# Patient Record
Sex: Female | Born: 1975 | Race: White | Hispanic: No | Marital: Married | Smoking: Former smoker
Health system: Southern US, Community
[De-identification: ages and names within clinical notes are randomized; demographics above are authoritative.]

## PROBLEM LIST (undated history)

## (undated) DIAGNOSIS — I10 Essential (primary) hypertension: Secondary | ICD-10-CM

## (undated) HISTORY — PX: OTHER SURGICAL HISTORY: SHX169

---

## 2012-02-14 ENCOUNTER — Emergency Department (INDEPENDENT_AMBULATORY_CARE_PROVIDER_SITE_OTHER): Payer: Managed Care, Other (non HMO)

## 2012-02-14 ENCOUNTER — Emergency Department (HOSPITAL_BASED_OUTPATIENT_CLINIC_OR_DEPARTMENT_OTHER)
Admission: EM | Admit: 2012-02-14 | Discharge: 2012-02-14 | Disposition: A | Discharge status: Home / Self Care | Payer: Managed Care, Other (non HMO) | Attending: Emergency Medicine | Admitting: Emergency Medicine

## 2012-02-14 ENCOUNTER — Encounter (HOSPITAL_BASED_OUTPATIENT_CLINIC_OR_DEPARTMENT_OTHER): Payer: Self-pay | Admitting: *Deleted

## 2012-02-14 DIAGNOSIS — Z331 Pregnant state, incidental: Secondary | ICD-10-CM

## 2012-02-14 DIAGNOSIS — D259 Leiomyoma of uterus, unspecified: Secondary | ICD-10-CM

## 2012-02-14 DIAGNOSIS — N898 Other specified noninflammatory disorders of vagina: Secondary | ICD-10-CM

## 2012-02-14 DIAGNOSIS — O200 Threatened abortion: Secondary | ICD-10-CM | POA: Insufficient documentation

## 2012-02-14 DIAGNOSIS — I10 Essential (primary) hypertension: Secondary | ICD-10-CM | POA: Insufficient documentation

## 2012-02-14 HISTORY — DX: Essential (primary) hypertension: I10

## 2012-02-14 LAB — URINE MICROSCOPIC-ADD ON

## 2012-02-14 LAB — BASIC METABOLIC PANEL
BUN: 8 mg/dL (ref 6–23)
CO2: 25 mEq/L (ref 19–32)
Calcium: 9.7 mg/dL (ref 8.4–10.5)
Creatinine, Ser: 0.6 mg/dL (ref 0.50–1.10)
GFR calc non Af Amer: >90 mL/min (ref >90)
Glucose, Bld: 85 mg/dL (ref 70–99)
Sodium: 139 mEq/L (ref 135–145)

## 2012-02-14 LAB — CBC
Hemoglobin: 13.7 g/dL (ref 12.0–15.0)
MCH: 32.2 pg (ref 26.0–34.0)
MCHC: 36.1 g/dL — ABNORMAL HIGH (ref 30.0–36.0)
MCV: 89.2 fL (ref 78.0–100.0)
RBC: 4.26 MIL/uL (ref 3.87–5.11)

## 2012-02-14 LAB — URINALYSIS, ROUTINE W REFLEX MICROSCOPIC
Bilirubin Urine: NEGATIVE
Glucose, UA: NEGATIVE mg/dL
Ketones, ur: NEGATIVE mg/dL
Leukocytes, UA: NEGATIVE
Protein, ur: 30 mg/dL — AB
pH: 7 (ref 5.0–8.0)

## 2012-02-14 LAB — WET PREP, GENITAL
Trich, Wet Prep: NONE SEEN
Yeast Wet Prep HPF POC: NONE SEEN

## 2012-02-14 MED ORDER — RHO D IMMUNE GLOBULIN 1500 UNIT/2ML IJ SOLN
INTRAMUSCULAR | Status: AC
  Filled 2012-02-14: qty 2

## 2012-02-14 MED ORDER — RHO D IMMUNE GLOBULIN 1500 UNIT/2ML IJ SOLN
300.0000 ug | Freq: Once | INTRAMUSCULAR | Status: AC
  Administered 2012-02-14: 300 ug via INTRAMUSCULAR

## 2012-02-14 NOTE — ED Provider Notes (Signed)
History     CSN: [id number]  Arrival date & time 02/14/12  1026   First MD Initiated Contact with Patient 02/14/12 1120      Chief Complaint  Patient presents with  . Vaginal Bleeding    (Consider location/radiation/quality/duration/timing/severity/associated sxs/prior treatment) HPI Comments: Patient presents with a positive pregnancy test just a few weeks ago.  She had noted some spotting last week but over the last day has noted some heavier bleeding at work this morning passed a large clot.  She has some mild lower abdominal cramping.  No nausea or vomiting.  This is her first pregnancy.  No other vaginal discharge.  No other abdominal surgeries.  No anticoagulation or antiplatelet agents.  Her primary care physician on finding out about her positive pregnancy test did stop her metoprolol and lisinopril as well.  Last menstrual period February 9 and normal for her.  Patient is a 36 y.o. female presenting with vaginal bleeding. The history is provided by the patient. No language interpreter was used.  Vaginal Bleeding This is a new problem. The current episode started 2 days ago. Pertinent negatives include no chest pain, no abdominal pain, no headaches and no shortness of breath.    Past Medical History  Diagnosis Date  . Hypertension     History reviewed. No pertinent past surgical history.  History reviewed. No pertinent family history.  History  Substance Use Topics  . Smoking status: Former Games developer  . Smokeless tobacco: Not on file  . Alcohol Use: Yes     stopped when found out pregnant prior to that glass or 2 of wine per day    OB History    Grav Para Term Preterm Abortions TAB SAB Ect Mult Living   1               Review of Systems  Constitutional: Negative.  Negative for fever and chills.  HENT: Negative.   Eyes: Negative.  Negative for discharge and redness.  Respiratory: Negative.  Negative for cough and shortness of breath.   Cardiovascular: Negative.   Negative for chest pain.  Gastrointestinal: Negative.  Negative for nausea, vomiting, abdominal pain and diarrhea.  Genitourinary: Positive for vaginal bleeding and pelvic pain. Negative for dysuria and vaginal discharge.  Musculoskeletal: Negative.  Negative for back pain.  Skin: Negative.  Negative for color change and rash.  Neurological: Negative.  Negative for syncope and headaches.  Hematological: Negative.  Negative for adenopathy.  Psychiatric/Behavioral: Negative.  Negative for confusion.  All other systems reviewed and are negative.    Allergies  Sulfa antibiotics  Home Medications   Current Outpatient Rx  Name Route Sig Dispense Refill  . METHYLDOPA 250 MG PO TABS Oral Take 250 mg by mouth 3 (three) times daily.      BP 141/93  Pulse 90  Resp 20  Ht 5' 4.5" (1.638 m)  Wt 155 lb (70.308 kg)  BMI 26.19 kg/m2  SpO2 100%  LMP 01/01/2012  Physical Exam  Nursing note and vitals reviewed. Constitutional: She is oriented to person, place, and time. She appears well-developed and well-nourished.  Non-toxic appearance. She does not have a sickly appearance.  HENT:  Head: Normocephalic and atraumatic.  Eyes: Conjunctivae, EOM and lids are normal. Pupils are equal, round, and reactive to light. No scleral icterus.  Neck: Trachea normal and normal range of motion. Neck supple.  Cardiovascular: Normal rate, regular rhythm and normal heart sounds.   Pulmonary/Chest: Effort normal and breath sounds normal. No  respiratory distress. She has no wheezes. She has no rales.  Abdominal: Soft. Normal appearance. She exhibits no distension. There is no tenderness. There is no rebound, no guarding and no CVA tenderness.  Genitourinary: There is no rash on the right labia. There is no rash on the left labia. There is bleeding around the vagina.       Closed cervical os.  Blood present in the vaginal vault.  No clots.  Musculoskeletal: Normal range of motion.  Neurological: She is alert  and oriented to person, place, and time. She has normal strength.  Skin: Skin is warm, dry and intact. No rash noted.  Psychiatric: She has a normal mood and affect. Her behavior is normal. Judgment and thought content normal.    ED Course  Procedures (including critical care time)  Labs Reviewed  URINALYSIS, ROUTINE W REFLEX MICROSCOPIC - Abnormal; Notable for the following:    Hgb urine dipstick LARGE (*)    Protein, ur 30 (*)    All other components within normal limits  PREGNANCY, URINE - Abnormal; Notable for the following:    Preg Test, Ur POSITIVE (*)    All other components within normal limits  CBC - Abnormal; Notable for the following:    MCHC 36.1 (*)    All other components within normal limits  BASIC METABOLIC PANEL - Abnormal; Notable for the following:    Potassium 3.3 (*)    All other components within normal limits  HCG, QUANTITATIVE, PREGNANCY - Abnormal; Notable for the following:    hCG, Beta Chain, Quant, S 8828 (*)    All other components within normal limits  WET PREP, GENITAL - Abnormal; Notable for the following:    Clue Cells Wet Prep HPF POC FEW (*)    WBC, Wet Prep HPF POC FEW (*)    All other components within normal limits  ABO/RH  URINE MICROSCOPIC-ADD ON  GC/CHLAMYDIA PROBE AMP, URINE  GC/CHLAMYDIA PROBE AMP, GENITAL   US Ob Comp Less 14 Wks  02/14/2012  *RADIOLOGY REPORT*  Clinical Data: Pregnant with vaginal bleeding.  OBSTETRIC <14 WK [place] AND TRANSVAGINAL OB US  Technique:  Both transabdominal and transvaginal ultrasound examinations were performed for complete evaluation of the gestation as well as the maternal uterus, adnexal regions, and pelvic cul-de-sac.  Transvaginal technique was performed to assess early pregnancy.  Comparison:  None.  Intrauterine gestational sac:  Visualized/normal in shape. Yolk sac: Present Embryo: Present Cardiac Activity: Present Heart Rate: 117 bpm  MSD:   mm      w     d CRL: 4.8   mm  six   w  one   d           [place]  EDC: 10/08/2012.  Maternal uterus/adnexae: No subchorionic hemorrhage. Right ovary not visualized. Corpus luteum cyst left ovary. Small amount of fluid noted in the endometrial canal. Small uterine fibroids are noted.  IMPRESSION:  1.  Single living intrauterine embryo estimated at 6 weeks and 1 day is gestation. 2.  No subchorionic hemorrhage. 3.  Normal left ovary.  Right ovary not visualized. 4.  Small uterine fibroids.  Original Report Authenticated By: P. Loralie Champagne, M.D.   US Ob Transvaginal  02/14/2012  *RADIOLOGY REPORT*  Clinical Data: Pregnant with vaginal bleeding.  OBSTETRIC <14 WK [place] AND TRANSVAGINAL OB US  Technique:  Both transabdominal and transvaginal ultrasound examinations were performed for complete evaluation of the gestation as well as the maternal uterus, adnexal regions, and  pelvic cul-de-sac.  Transvaginal technique was performed to assess early pregnancy.  Comparison:  None.  Intrauterine gestational sac:  Visualized/normal in shape. Yolk sac: Present Embryo: Present Cardiac Activity: Present Heart Rate: 117 bpm  MSD:   mm      w     d CRL: 4.8   mm  six   w  one   d           [place] EDC: 10/08/2012.  Maternal uterus/adnexae: No subchorionic hemorrhage. Right ovary not visualized. Corpus luteum cyst left ovary. Small amount of fluid noted in the endometrial canal. Small uterine fibroids are noted.  IMPRESSION:  1.  Single living intrauterine embryo estimated at 6 weeks and 1 day is gestation. 2.  No subchorionic hemorrhage. 3.  Normal left ovary.  Right ovary not visualized. 4.  Small uterine fibroids.  Original Report Authenticated By: P. Loralie Champagne, M.D.     No diagnosis found.    MDM  Patient with a threatened abortion at this point in time.  She shows signs of an IUP on her ultrasound with quantitative hCG levels that correlate.  Patient also has some mild bleeding.  She was given RhoGAM 2 to her Rh status being negative.  She already had some followup scheduled with her  OB on April 9 and have encouraged her to call and see about a week check sooner given the bleeding and cramping she's been experiencing.  Patient understands that she may still going to have a miscarriage but it is unclear at this time.  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        Nat Christen, MD 02/14/12 1325

## 2012-02-14 NOTE — ED Notes (Signed)
Last  Period feb 9 had positive pregnancy test had mild spotting brownish when wiping last week. 20 min pta started having bright red blood no cramping hasnt seen OB yet

## 2012-02-14 NOTE — ED Notes (Signed)
Blood Bank from lab called and reported pt is AB negative.  Dr. Golda Acre informed.

## 2012-02-14 NOTE — Discharge Instructions (Signed)
Threatened Miscarriage  Bleeding during the first 20 weeks of pregnancy is common. This is sometimes called a threatened miscarriage. This is a pregnancy that is threatening to end before the twentieth week of pregnancy. Often this bleeding stops with bed rest or decreased activities as suggested by your caregiver and the pregnancy continues without any more problems. You may be asked to not have sexual intercourse, have orgasms or use tampons until further notice. Sometimes a threatened miscarriage can progress to a complete or incomplete miscarriage. This may or may not require further treatment. Some miscarriages occur before a woman misses a menstrual period and knows she is pregnant.  Miscarriages occur in 15 to 20% of all pregnancies and usually occur during the first 13 weeks of the pregnancy. The exact cause of a miscarriage is usually never known. A miscarriage is natures way of ending a pregnancy that is abnormal or would not make it to term. There are some things that may put you at risk to have a miscarriage, such as:   Hormone problems.   Infection of the uterus or cervix.   Chronic illness, diabetes for example, especially if it is not controlled.   Abnormal shaped uterus.   Fibroids in the uterus.   Incompetent cervix (the cervix is too weak to hold the baby).   Smoking.   Drinking too much alcohol. It's best not to drink any alcohol when you are pregnant.   Taking illegal drugs.  TREATMENT   When a miscarriage becomes complete and all products of conception (all the tissue in the uterus) have been passed, often no treatment is needed. If you think you passed tissue, save it in a container and take it to your doctor for evaluation. If the miscarriage is incomplete (parts of the fetus or placenta remain in the uterus), further treatment may be needed. The most common reason for further treatment is continued bleeding (hemorrhage) because pregnancy tissue did not pass out of the uterus. This  often occurs if a miscarriage is incomplete. Tissue left behind may also become infected. Treatment usually is dilatation and curettage (the removal of the remaining products of pregnancy. This can be done by a simple sucking procedure (suction curettage) or a simple scraping of the inside of the uterus. This may be done in the hospital or in the caregiver's office. This is only done when your caregiver knows that there is no chance for the pregnancy to proceed to term. This is determined by physical examination, negative pregnancy test, falling pregnancy hormone count and/or, an ultrasound revealing a dead fetus.  Miscarriages are often a very emotional time for prospective mothers and fathers. This is not you or your partners fault. It did not occur because of an inadequacy in you or your partner. Nearly all miscarriages occur because the pregnancy has started off wrongly. At least half of these pregnancies have a chromosomal abnormality. It is almost always not inherited. Others may have developmental problems with the fetus or placenta. This does not always show up even when the products miscarried are studied under the microscope. The miscarriage is nearly always not your fault and it is not likely that you could have prevented it from happening. If you are having emotional and grieving problems, talk to your health care provider and even seek counseling, if necessary, before getting pregnant again. You can begin trying for another pregnancy as soon as your caregiver says it is OK.  HOME CARE INSTRUCTIONS    Your caregiver may order   and cramping you are having. You may be limited to only getting up to go to the bathroom. You may be allowed to continue light activity. You may need to make arrangements for the care of your other children and for any other responsibilities.   Keep track of the number of pads you use each day, how often you have to change pads  and how saturated (soaked) they are. Record this information.   DO NOT USE TAMPONS. Do not douche, have sexual intercourse or orgasms until approved by your caregiver.   You may receive a follow up appointment for re-evaluation of your pregnancy and a repeat blood test. Re-evaluation often occurs after 2 days and again in 4 to 6 weeks. It is very important that you follow-up in the recommended time period.   If you are Rh negative and the father is Rh positive or you do not know the fathers' blood type, you may receive a shot (Rh immune globulin) to help prevent abnormal antibodies that can develop and affect the baby in any future pregnancies.  SEEK IMMEDIATE MEDICAL CARE IF:  You have severe cramps in your stomach, back, or abdomen.   You have a sudden onset of severe pain in the lower part of your abdomen.   You develop chills.   You run an unexplained temperature of 101 F (38.3 C) or higher.   You pass large clots or tissue. Save any tissue for your caregiver to inspect.   Your bleeding increases or you become light-headed, weak, or have fainting episodes.   You have a gush of fluid from your vagina.   You pass out. This could mean you have a tubal (ectopic) pregnancy.  Document Released: 11/08/2005 Document Revised: 10/28/2011 Document Reviewed: 06/24/2008 [facility] Patient Information 2012 [place], [place].Rh0 [D] Immune Globulin injection What is this medicine? RhO [D] IMMUNE GLOBULIN (i MYOON GLOB yoo lin) is used to treat idiopathic thrombocytopenic purpura (ITP). This medicine is used in RhO negative mothers who are pregnant with a RhO positive child. It is also used after a transfusion of RhO positive blood into a RhO negative person. This medicine may be used for other purposes; ask your health care provider or pharmacist if you have questions. What should I tell my health care provider before I take this medicine? They need to know if you have any of these  conditions: -bleeding disorders -low levels of immunoglobulin A in the body -no spleen -an unusual or allergic reaction to human immune globulin, other medicines, foods, dyes, or preservatives -pregnant or trying to get pregnant -breast-feeding How should I use this medicine? This medicine is for injection into a muscle or into a vein. It is given by a health care professional in a hospital or clinic setting. Talk to your pediatrician regarding the use of this medicine in children. This medicine is not approved for use in children. Overdosage: If you think you have taken too much of this medicine contact a poison control center or emergency room at once. NOTE: This medicine is only for you. Do not share this medicine with others. What if I miss a dose? It is important not to miss your dose. Call your doctor or health care professional if you are unable to keep an appointment. What may interact with this medicine? -live virus vaccines, like measles, mumps, or rubella This list may not describe all possible interactions. Give your health care provider a list of all the medicines, herbs, non-prescription drugs, or dietary supplements you  use. Also tell them if you smoke, drink alcohol, or use illegal drugs. Some items may interact with your medicine. What should I watch for while using this medicine? This medicine is made from human blood. It may be possible to pass an infection in this medicine. Talk to your doctor about the risks and benefits of this medicine. This medicine may interfere with live virus vaccines. Before you get live virus vaccines tell your health care professional if you have received this medicine within the past 3 months. What side effects may I notice from receiving this medicine? Side effects that you should report to your doctor or health care professional as soon as possible: -allergic reactions like skin rash, itching or hives, swelling of the face, lips, or  tongue -breathing problems -chest pain or tightness -yellowing of the eyes or skin Side effects that usually do not require medical attention (report to your doctor or health care professional if they continue or are bothersome): -fever -pain and tenderness at site where injected This list may not describe all possible side effects. Call your doctor for medical advice about side effects. You may report side effects to FDA at 1-800-FDA-1088. Where should I keep my medicine? This drug is given in a hospital or clinic and will not be stored at home. NOTE: This sheet is a summary. It may not cover all possible information. If you have questions about this medicine, talk to your doctor, pharmacist, or health care provider.  2012, [employer]. (07/08/2008 2:06:10 PM)

## 2012-02-14 NOTE — ED Notes (Signed)
Pt returned from ultrasound.  No change in pt condition. 

## 2012-02-14 NOTE — ED Notes (Signed)
Patient transported to Ultrasound 

## 2012-02-15 LAB — GC/CHLAMYDIA PROBE AMP, GENITAL: GC Probe Amp, Genital: NEGATIVE

## 2012-03-01 ENCOUNTER — Other Ambulatory Visit: Payer: Self-pay | Admitting: Obstetrics and Gynecology

## 2012-04-14 ENCOUNTER — Inpatient Hospital Stay (HOSPITAL_COMMUNITY)
Admission: AD | Admit: 2012-04-14 | Discharge: 2012-04-14 | Disposition: A | Discharge status: Home / Self Care | Payer: Managed Care, Other (non HMO) | Source: Ambulatory Visit | Attending: Emergency Medicine | Admitting: Emergency Medicine

## 2012-04-14 ENCOUNTER — Encounter (HOSPITAL_COMMUNITY): Payer: Self-pay

## 2012-04-14 ENCOUNTER — Inpatient Hospital Stay (HOSPITAL_COMMUNITY): Payer: Managed Care, Other (non HMO)

## 2012-04-14 DIAGNOSIS — I10 Essential (primary) hypertension: Secondary | ICD-10-CM | POA: Insufficient documentation

## 2012-04-14 DIAGNOSIS — R0789 Other chest pain: Secondary | ICD-10-CM

## 2012-04-14 DIAGNOSIS — R079 Chest pain, unspecified: Secondary | ICD-10-CM | POA: Insufficient documentation

## 2012-04-14 LAB — D-DIMER, QUANTITATIVE: D-Dimer, Quant: <0.22 ug/mL-FEU (ref 0.00–0.48)

## 2012-04-14 MED ORDER — ASPIRIN 81 MG PO CHEW
CHEWABLE_TABLET | ORAL | Status: AC
  Administered 2012-04-14: 324 mg
  Filled 2012-04-14: qty 4

## 2012-04-14 MED ORDER — NITROGLYCERIN 0.4 MG SL SUBL
SUBLINGUAL_TABLET | SUBLINGUAL | Status: AC
  Administered 2012-04-14: 17:00:00
  Filled 2012-04-14: qty 25

## 2012-04-14 MED ORDER — ASPIRIN 325 MG PO TABS
325.0000 mg | ORAL_TABLET | Freq: Once | ORAL | Status: DC
  Filled 2012-04-14: qty 1

## 2012-04-14 NOTE — MAU Note (Signed)
O2 at 4\liters per min per chest pain protocol.

## 2012-04-14 NOTE — MAU Note (Signed)
Lauren Blackburn CNM into see patient.

## 2012-04-14 NOTE — MAU Note (Signed)
Pt did complain of chest discomfort and NTG SL administrated.

## 2012-04-14 NOTE — ED Notes (Signed)
Pt. States "I took my blood pressure this morning and it was higher than normal and I also had sharp chest pain radiating down left arm and I got scared and decided to come in".  Pt. Reports history of HTN, last dose last night.

## 2012-04-14 NOTE — MAU Note (Signed)
BP 184/115 with some chest pains patient does not have a PCP was told to come in by Dr. Vance Gather nurse, having some child discomfort, patient is not pregnant, hurts through to her back.

## 2012-04-14 NOTE — ED Provider Notes (Signed)
History     CSN: [id number]  Arrival date & time 04/14/12  1555   First MD Initiated Contact with Patient 04/14/12 1730      Chief Complaint  Patient presents with  . Hypertension    (Consider location/radiation/quality/duration/timing/severity/associated sxs/prior treatment) HPI Comments: Pt with hx of htn.  6 weeks ago had D&C after [redacted] weeks pregnant.  Today has had intermittent sharp chest pain in 2-3 second episodes.  No nausea or dyspnea.  No recent leg swelling or immobility.  Otherwise doing well.  Checked BP today and it was elevated so came to ED.  Patient is a 36 y.o. female presenting with hypertension. The history is provided by the patient.  Hypertension This is a recurrent problem. The current episode started today. The problem occurs constantly. The problem has been unchanged. Pertinent negatives include no abdominal pain, chest pain, congestion, fever, nausea, numbness, rash or vomiting. The symptoms are aggravated by nothing. She has tried nothing for the symptoms.    Past Medical History  Diagnosis Date  . Hypertension     No past surgical history on file.  No family history on file.  History  Substance Use Topics  . Smoking status: Former Games developer  . Smokeless tobacco: Not on file  . Alcohol Use: Yes     stopped when found out pregnant prior to that glass or 2 of wine per day    OB History    Grav Para Term Preterm Abortions TAB SAB Ect Mult Living   1               Review of Systems  Constitutional: Negative for fever and activity change.  HENT: Negative for congestion.   Eyes: Negative for visual disturbance.  Respiratory: Negative for chest tightness and shortness of breath.   Cardiovascular: Negative for chest pain and leg swelling.  Gastrointestinal: Negative for nausea, vomiting and abdominal pain.  Genitourinary: Negative for dysuria.  Skin: Negative for rash.  Neurological: Negative for syncope and numbness.  Psychiatric/Behavioral:  Negative for behavioral problems.    Allergies  Sulfa antibiotics  Home Medications   Current Outpatient Rx  Name Route Sig Dispense Refill  . ATENOLOL 50 MG PO TABS Oral Take 50 mg by mouth every evening.    Marland Kitchen OMEGA-3 FATTY ACIDS 1000 MG PO CAPS Oral Take 1 g by mouth 2 (two) times daily.     . IBUPROFEN 200 MG PO TABS Oral Take 200 mg by mouth every 6 (six) hours as needed. For pain    . LORATADINE 10 MG PO TABS Oral Take 10 mg by mouth daily as needed. For allergies    . PRENATAL MULTIVITAMIN CH Oral Take 1 tablet by mouth daily.      BP 163/100  Pulse 86  Temp(Src) 98.8 F (37.1 C) (Oral)  Resp 16  Ht 5' 4.5" (1.638 m)  Wt 156 lb 9.6 oz (71.033 kg)  BMI 26.47 kg/m2  SpO2 99%  LMP 04/07/2012  Breastfeeding? No  Physical Exam  Constitutional: She is oriented to person, place, and time. She appears well-developed and well-nourished.  HENT:  Head: Normocephalic and atraumatic.  Eyes: Conjunctivae and EOM are normal. Pupils are equal, round, and reactive to light. No scleral icterus.  Neck: Normal range of motion. Neck supple.  Cardiovascular: Normal rate and regular rhythm.  Exam reveals no gallop and no friction rub.   No murmur heard. Pulmonary/Chest: Effort normal and breath sounds normal. No respiratory distress. She has no wheezes. She  has no rales. She exhibits tenderness (mild, central).  Abdominal: Soft. She exhibits no distension and no mass. There is no tenderness. There is no rebound and no guarding.  Musculoskeletal: Normal range of motion.  Neurological: She is alert and oriented to person, place, and time. She has normal reflexes. No cranial nerve deficit.  Skin: Skin is warm and dry. No rash noted.  Psychiatric: She has a normal mood and affect. Her behavior is normal. Judgment and thought content normal.    ED Course  Procedures (including critical care time)   Date: 04/14/2012  Rate: 70  Rhythm: normal sinus rhythm  QRS Axis: normal  Intervals:  normal  ST/T Wave abnormalities: normal  Conduction Disutrbances:none  Narrative Interpretation:   Old EKG Reviewed: unchanged from this am     Labs Reviewed  D-DIMER, QUANTITATIVE   Dg Chest 2 View  04/14/2012  *RADIOLOGY REPORT*  Clinical Data: Hypertension.  CHEST - 2 VIEW  Comparison: None.  Findings: Mildly prominent cardiopericardial silhouette is thought to be due to cardiac accentuation related to pectus excavatum.  The lungs appear clear.  No pleural effusion noted.  IMPRESSION:  1.  Pectus excavatum, causing accentuation of the cardiac shadow. Otherwise, no significant abnormality identified.  Original Report Authenticated By: Dellia Cloud, M.D.     1. Hypertension   2. Atypical chest pain       MDM  Pt with hx of htn.  6 weeks ago had D&C after [redacted] weeks pregnant.  Today has had intermittent sharp chest pain in 2-3 second episodes.  No nausea or dyspnea.  No recent leg swelling or immobility.  Otherwise doing well.  Checked BP today and it was elevated so came to ED.  VSS and well appearing.  CXR and EKG uncocnerning.  Very atypical hx for ACS or hypertensive urgency type chest pain.  Very lwo suspicion for PE.  Ddimer negative.  Pt feeling well in ED.  No evidence of end organ damage from htn.  Has PCP and will discuss restarting BP meds that pt was on before pregnancy.        Army Chaco, MD 04/14/12 2232

## 2012-04-14 NOTE — MAU Provider Note (Signed)
Chief Complaint:  Hypertension    First Provider Initiated Contact with Patient 04/14/12 1614      Lauren Blackburn is  36 y.o. G1P0.  Patient's last menstrual period was 04/07/2012..   She presents complaining of HTN and chest pain radiating into left arm.  Obstetrical/Gynecological History: OB History    Grav Para Term Preterm Abortions TAB SAB Ect Mult Living   1               Past Medical History: Past Medical History  Diagnosis Date  . Hypertension     Past Surgical History: No past surgical history on file.  Family History: No family history on file.  Social History: History  Substance Use Topics  . Smoking status: Former Games developer  . Smokeless tobacco: Not on file  . Alcohol Use: Yes     stopped when found out pregnant prior to that glass or 2 of wine per day    Allergies:  Allergies  Allergen Reactions  . Sulfa Antibiotics     Prescriptions prior to admission  Medication Sig Dispense Refill  . methyldopa (ALDOMET) 250 MG tablet Take 250 mg by mouth 3 (three) times daily.        Review of Systems - History obtained from the patient General ROS: negative for - chills or fever Hematological and Lymphatic ROS: negative Respiratory ROS: no cough, shortness of breath, or wheezing Cardiovascular ROS: positive for - chest pain, negative for - dyspnea on exertion, edema or palpitations Gastrointestinal ROS: no abdominal pain, change in bowel habits, or black or bloody stools negative for - diarrhea or nausea/vomiting Genito-Urinary ROS: no dysuria, trouble voiding, or hematuria Neurological ROS: no TIA or stroke symptoms  Physical Exam   Blood pressure 189/120, pulse 80, temperature 98.3 F (36.8 C), temperature source Oral, resp. rate 18, height 5' 4.5" (1.638 m), weight 156 lb 9.6 oz (71.033 kg), last menstrual period 04/07/2012, SpO2 100.00%, unknown if currently breastfeeding.  General: General appearance - alert, well appearing, and in no distress,  oriented to person, place, and time and teary and anxious Mental status - alert, oriented to person, place, and time, normal mood, behavior, speech, dress, motor activity, and thought processes, anxious Eyes - left eye normal, right eye normal Chest - clear to auscultation, no wheezes, rales or rhonchi, symmetric air entry Heart - normal rate, regular rhythm, normal S1, S2, no murmurs, rubs, clicks or gallops Neurological - alert, oriented, normal speech, no focal findings or movement disorder noted, screening mental status exam normal, cranial nerves II through XII intact Extremities - peripheral pulses normal, no pedal edema, no clubbing or cyanosis Focused Gynecological Exam: examination not indicated  ED Course: Initiate CP protocol and arrange for transport to [facility]  MD Consult for Transfer: EDP unavailable to managing trauma, spoke with Charge RN. EDP to call with questions prn. [facility] to transport. Pt in stable condition for transfer. Dr. Arelia Sneddon notified  Assessment: Hypertension Chest Pain  Plan: CP Protocol Initiated Transfer to [facility] ED for further evaluation.  Lauren Blackburn E. 04/14/2012,4:22 PM

## 2012-04-14 NOTE — ED Notes (Signed)
Pt received from [facility] via [facility]- pt went to [facility] for chest pressure, pain. Pt given two nitro, aspirin at [facility]. Pt denies chest pain at this time. Pt's EKG NSR. Pt reports headache. Hx of hypertension.

## 2012-04-14 NOTE — Discharge Instructions (Signed)
Chest Pain (Nonspecific) It is often hard to give a specific diagnosis for the cause of chest pain. There is always a chance that your pain could be related to something serious, such as a heart attack or a blood clot in the lungs. You need to follow up with your caregiver for further evaluation. CAUSES   Heartburn.   Pneumonia or bronchitis.   Anxiety or stress.   Inflammation around your heart (pericarditis) or lung (pleuritis or pleurisy).   A blood clot in the lung.   A collapsed lung (pneumothorax). It can develop suddenly on its own (spontaneous pneumothorax) or from injury (trauma) to the chest.   Shingles infection (herpes zoster virus).  The chest wall is composed of bones, muscles, and cartilage. Any of these can be the source of the pain.  The bones can be bruised by injury.   The muscles or cartilage can be strained by coughing or overwork.   The cartilage can be affected by inflammation and become sore (costochondritis).  DIAGNOSIS  Lab tests or other studies, such as X-rays, electrocardiography, stress testing, or cardiac imaging, may be needed to find the cause of your pain.  TREATMENT   Treatment depends on what may be causing your chest pain. Treatment may include:   Acid blockers for heartburn.   Anti-inflammatory medicine.   Pain medicine for inflammatory conditions.   Antibiotics if an infection is present.   You may be advised to change lifestyle habits. This includes stopping smoking and avoiding alcohol, caffeine, and chocolate.   You may be advised to keep your head raised (elevated) when sleeping. This reduces the chance of acid going backward from your stomach into your esophagus.   Most of the time, nonspecific chest pain will improve within 2 to 3 days with rest and mild pain medicine.  HOME CARE INSTRUCTIONS   If antibiotics were prescribed, take your antibiotics as directed. Finish them even if you start to feel better.   For the next few  days, avoid physical activities that bring on chest pain. Continue physical activities as directed.   Do not smoke.   Avoid drinking alcohol.   Only take over-the-counter or prescription medicine for pain, discomfort, or fever as directed by your caregiver.   Follow your caregiver's suggestions for further testing if your chest pain does not go away.   Keep any follow-up appointments you made. If you do not go to an appointment, you could develop lasting (chronic) problems with pain. If there is any problem keeping an appointment, you must call to reschedule.  SEEK MEDICAL CARE IF:   You think you are having problems from the medicine you are taking. Read your medicine instructions carefully.   Your chest pain does not go away, even after treatment.   You develop a rash with blisters on your chest.  SEEK IMMEDIATE MEDICAL CARE IF:   You have increased chest pain or pain that spreads to your arm, neck, jaw, back, or abdomen.   You develop shortness of breath, an increasing cough, or you are coughing up blood.   You have severe back or abdominal pain, feel nauseous, or vomit.   You develop severe weakness, fainting, or chills.   You have a fever.  THIS IS AN EMERGENCY. Do not wait to see if the pain will go away. Get medical help at once. Call your local emergency services (911 in [place] Do not drive yourself to the hospital. MAKE SURE YOU:   Understand these instructions.     Will watch your condition.   Will get help right away if you are not doing well or get worse.  Document Released: 08/18/2005 Document Revised: 10/28/2011 Document Reviewed: 06/13/2008 [facility] Patient Information 2012 [place], [place].Hypertension As your heart beats, it forces blood through your arteries. This force is your blood pressure. If the pressure is too high, it is called hypertension (HTN) or high blood pressure. HTN is dangerous because you may have it and not know it. High blood pressure may mean  that your heart has to work harder to pump blood. Your arteries may be narrow or stiff. The extra work puts you at risk for heart disease, stroke, and other problems.  Blood pressure consists of two numbers, a higher number over a lower, 110/72, for example. It is stated as "110 over 72." The ideal is below 120 for the top number (systolic) and under 80 for the bottom (diastolic). Write down your blood pressure today. You should pay close attention to your blood pressure if you have certain conditions such as:  Heart failure.   Prior heart attack.   Diabetes   Chronic kidney disease.   Prior stroke.   Multiple risk factors for heart disease.  To see if you have HTN, your blood pressure should be measured while you are seated with your arm held at the level of the heart. It should be measured at least twice. A one-time elevated blood pressure reading (especially in the Emergency Department) does not mean that you need treatment. There may be conditions in which the blood pressure is different between your right and left arms. It is important to see your caregiver soon for a recheck. Most people have essential hypertension which means that there is not a specific cause. This type of high blood pressure may be lowered by changing lifestyle factors such as:  Stress.   Smoking.   Lack of exercise.   Excessive weight.   Drug/tobacco/alcohol use.   Eating less salt.  Most people do not have symptoms from high blood pressure until it has caused damage to the body. Effective treatment can often prevent, delay or reduce that damage. TREATMENT  When a cause has been identified, treatment for high blood pressure is directed at the cause. There are a large number of medications to treat HTN. These fall into several categories, and your caregiver will help you select the medicines that are best for you. Medications may have side effects. You should review side effects with your caregiver. If your  blood pressure stays high after you have made lifestyle changes or started on medicines,   Your medication(s) may need to be changed.   Other problems may need to be addressed.   Be certain you understand your prescriptions, and know how and when to take your medicine.   Be sure to follow up with your caregiver within the time frame advised (usually within two weeks) to have your blood pressure rechecked and to review your medications.   If you are taking more than one medicine to lower your blood pressure, make sure you know how and at what times they should be taken. Taking two medicines at the same time can result in blood pressure that is too low.  SEEK IMMEDIATE MEDICAL CARE IF:  You develop a severe headache, blurred or changing vision, or confusion.   You have unusual weakness or numbness, or a faint feeling.   You have severe chest or abdominal pain, vomiting, or breathing problems.  MAKE SURE YOU:  Understand these instructions.   Will watch your condition.   Will get help right away if you are not doing well or get worse.  Document Released: 11/08/2005 Document Revised: 10/28/2011 Document Reviewed: 06/28/2008 ExitCare Patient Information 2012 ExitCare, [place] RESOURCE GUIDE  Dental Problems  Patients with Medicaid: [facility]                     [phone] [address]                                           Phone:  [phone]                                                  If unable to pay or uninsured, contact:  [facility] or [facility]. to become qualified for the adult dental clinic.  Chronic Pain Problems Contact [facility]  [phone] Patients need to be referred by their primary care doctor.  Insufficient Money for Medicine Contact United Way:  call "211" or [facility] 630-335-1224.  No Primary Care Doctor Call [facility]  [phone] Other agencies that provide inexpensive medical care     [facility]  [phone]    [facility]  [phone]    [facility]  [phone]    [facility]  [phone]    Planned Parenthood  [phone]    [facility]  [phone]  Substance Abuse Resources [facility]  [phone] [facility] [phone] [facility] [phone] Floydene Flock [phone] Residential & Outpatient Substance Abuse Program  8588438487  Psychological Services [facility]  [phone] [facility]  [phone] [facility]   [phone] (emergency services ([phone])  Abuse/Neglect [facility] Child Abuse Hotline (807)094-8848 [facility] Child Abuse Hotline (615)363-8316 (After Hours)  Emergency Shelter [facility] 272-007-6020  Maternity Homes [facility] at [facility] [place] [facility] [place] [phone] [facility] Alert Services [phone]  MRSA Hotline #:   623-248-5568    [facility] Resources  [facility]  United [employer]                           [facility] Dept. [address] [place]                     [address]         [address]  [place]                                               Cristobal Goldmann Phone:  [phone]  Phone:  [phone]                   Phone:  [phone]  [facility] Phone:  [phone]  [facility] Child Abuse Hotline 831-696-1575 5073692834 (After Hours)Hypertension As your heart beats, it forces blood through your arteries. This force is your blood pressure. If the pressure is too high, it is called hypertension (HTN) or high blood pressure. HTN is dangerous because you may have it and not know it. High blood pressure may mean that your heart has to work harder to pump blood. Your arteries may be narrow or stiff. The extra work puts you at risk for heart  disease, stroke, and other problems.  Blood pressure consists of two numbers, a higher number over a lower, 110/72, for example. It is stated as "110 over 72." The ideal is below 120 for the top number (systolic) and under 80 for the bottom (diastolic). Write down your blood pressure today. You should pay close attention to your blood pressure if you have certain conditions such as: Heart failure.  Prior heart attack.  Diabetes  Chronic kidney disease.  Prior stroke.  Multiple risk factors for heart disease.  To see if you have HTN, your blood pressure should be measured while you are seated with your arm held at the level of the heart. It should be measured at least twice. A one-time elevated blood pressure reading (especially in the Emergency Department) does not mean that you need treatment. There may be conditions in which the blood pressure is different between your right and left arms. It is important to see your caregiver soon for a recheck. Most people have essential hypertension which means that there is not a specific cause. This type of high blood pressure may be lowered by changing lifestyle factors such as: Stress.  Smoking.  Lack of exercise.  Excessive weight.  Drug/tobacco/alcohol use.  Eating less salt.  Most people do not have symptoms from high blood pressure until it has caused damage to the body. Effective treatment can often prevent, delay or reduce that damage. TREATMENT  When a cause has been identified, treatment for high blood pressure is directed at the cause. There are a large number of medications to treat HTN. These fall into several categories, and your caregiver will help you select the medicines that are best for you. Medications may have side effects. You should review side effects with your caregiver. If your blood pressure stays high after you have made lifestyle changes or started on medicines,  Your medication(s) may need to be changed.  Other problems may  need to be addressed.  Be certain you understand your prescriptions, and know how and when to take your medicine.  Be sure to follow up with your caregiver within the time frame advised (usually within two weeks) to have your blood pressure rechecked and to review your medications.  If you are taking more than one medicine to lower your blood pressure, make sure you know how and at what times they should be taken. Taking two medicines at the same time can result in blood pressure that is too low.  SEEK IMMEDIATE MEDICAL CARE IF: You develop a severe headache, blurred or changing vision, or confusion.  You have unusual weakness or numbness, or a faint feeling.  You have severe chest or abdominal pain, vomiting, or breathing problems.  MAKE SURE YOU:  Understand these instructions.  Will watch your condition.  Will get  help right away if you are not doing well or get worse.  Document Released: 11/08/2005 Document Revised: 10/28/2011 Document Reviewed: 06/28/2008 [facility] Patient Information 2012 [place], [place].C.

## 2012-04-14 NOTE — MAU Note (Signed)
Patient denies chest pain did not administer nitrogylcerin.

## 2012-04-14 NOTE — ED Notes (Signed)
Pt was sent from [facility] because she was complaining of left sided chest pain that radiated to her left arm. She was sent to [facility] because her blood prerssure has been elevated at home. She has a hx of HTN. She take blood pressure medication on a daily basis. Currently no chest pain or respiratory distress. She is alert and oriented. Pain is at a 0 put of 10. Will continue to monitor.

## 2012-04-14 NOTE — ED Provider Notes (Signed)
I saw and evaluated the patient, reviewed the resident's note and I agree with the findings and plan.  Pt seen and evaluated- pt with hypertension, some chest pain- approx 6 weeks after D and C for miscarriage- EKG, CXR, d-dimer reassuring.  Low suspicion for ACS in this patient.   EKG reviewed and agree with the findings as documented by the resident  Ethelda Chick, MD 04/14/12 2241

## 2012-04-14 NOTE — MAU Note (Signed)
Report called to [facility] RN Charge [facility] ED

## 2012-12-26 ENCOUNTER — Other Ambulatory Visit: Payer: Self-pay | Admitting: Family Medicine

## 2012-12-26 DIAGNOSIS — I10 Essential (primary) hypertension: Secondary | ICD-10-CM

## 2013-01-01 ENCOUNTER — Other Ambulatory Visit: Payer: Managed Care, Other (non HMO)

## 2013-01-08 ENCOUNTER — Ambulatory Visit
Admission: RE | Admit: 2013-01-08 | Discharge: 2013-01-08 | Disposition: A | Discharge status: Home / Self Care | Payer: BC Managed Care – PPO | Source: Ambulatory Visit | Attending: Family Medicine | Admitting: Family Medicine

## 2013-01-08 DIAGNOSIS — I10 Essential (primary) hypertension: Secondary | ICD-10-CM

## 2014-09-11 DIAGNOSIS — I10 Essential (primary) hypertension: Secondary | ICD-10-CM | POA: Insufficient documentation

## 2014-09-23 ENCOUNTER — Encounter (HOSPITAL_COMMUNITY): Payer: Self-pay

## 2014-10-21 ENCOUNTER — Encounter: Payer: Self-pay | Admitting: Cardiology

## 2014-10-21 ENCOUNTER — Ambulatory Visit (INDEPENDENT_AMBULATORY_CARE_PROVIDER_SITE_OTHER): Payer: BC Managed Care – PPO | Admitting: Cardiology

## 2014-10-21 VITALS — BP 190/104 | HR 78 | Ht 64.5 in | Wt 163.0 lb

## 2014-10-21 DIAGNOSIS — Z720 Tobacco use: Secondary | ICD-10-CM

## 2014-10-21 DIAGNOSIS — I10 Essential (primary) hypertension: Secondary | ICD-10-CM

## 2014-10-21 MED ORDER — ATENOLOL 50 MG PO TABS: 50.0000 mg | ORAL_TABLET | Freq: Every day | ORAL | Status: DC

## 2014-10-21 MED ORDER — LISINOPRIL-HYDROCHLOROTHIAZIDE 20-12.5 MG PO TABS: 1.0000 | ORAL_TABLET | Freq: Every day | ORAL | Status: DC

## 2014-10-21 NOTE — Progress Notes (Signed)
[place]. [address] [place], [place]  [address] Phone: [phone] Fax:  [phone]  Date:  10/21/2014   ID:  Lauren Blackburn, DOB August 15, 1976, MRN [id number]  PCP:  Default, Provider, MD   History of Present Illness: Lauren Blackburn is a 38 y.o. female you for the evaluation of uncontrolled hypertension. Prior renal duplex was normal in 2014. Blood pressures at home and been averaging 170/110. Spironolactone 25 mg was added. Currently on labetalol 300 mg twice a day. Creatinine is 0.7 potassium point was 2.7 brings up the possibility of hyperaldosteronism perhaps. However, this was in the setting of chlorthalidone. Ibuprofen is listed on her medication profile. Rare use.   Previous medications listed under allergies or amlodipine causing confusion, Bystolic causing tongue swelling, chlorthalidone causing hypokalemia, metoprolol succinate causing fatigue, triamterene hydrochlorothiazide-ineffective for blood pressure, sulfa-hives.  Atenolol and lisinopril HCT was taken in [place]. BP was low at the time. This combination seemed to work she states. She was taken off of lisinopril previously because of pregnancy.  Continue to work on tobacco cessation.  She does not have any flushing sensations, uncontrolled headaches, visual disturbances, strokelike symptoms.   Wt Readings from Last 3 Encounters:  10/21/14 163 lb (73.936 kg)  04/14/12 156 lb 9.6 oz (71.033 kg)  02/14/12 155 lb (70.308 kg)     Past Medical History  Diagnosis Date  . Hypertension     No past surgical history on file.  Current Outpatient Prescriptions  Medication Sig Dispense Refill  . ibuprofen (ADVIL,MOTRIN) 200 MG tablet Take 200 mg by mouth every 6 (six) hours as needed. For pain    . labetalol (NORMODYNE) 300 MG tablet     . loratadine (CLARITIN) 10 MG tablet Take 10 mg by mouth daily as needed. For allergies    . Prenatal Vit-Fe Fumarate-FA (PRENATAL MULTIVITAMIN) TABS Take 1 tablet by  mouth daily.    Marland Kitchen spironolactone (ALDACTONE) 25 MG tablet      No current facility-administered medications for this visit.    Allergies:    Allergies  Allergen Reactions  . Nebivolol Hcl   . Sulfa Antibiotics Hives    Social History:  The patient  reports that she has quit smoking. She does not have any smokeless tobacco history on file. She reports that she drinks alcohol. She reports that she does not use illicit drugs.   Family History  Problem Relation Age of Onset  . Hypertension Father     ROS:  Please see the history of present illness.   Denies any syncope, bleeding, orthopnea, PND, chest pain   All other systems reviewed and negative.   PHYSICAL EXAM: VS:  BP 190/104 mmHg  Pulse 78  Ht 5' 4.5" (1.638 m)  Wt 163 lb (73.936 kg)  BMI 27.56 kg/m2 Well nourished, well developed, in no acute distress HEENT: normal, [place]/AT, at rest, there is slight esotropia. Neck: no JVD, normal carotid upstroke, no bruit Cardiac:  normal S1, ; RRR; no murmur Lungs:  clear to auscultation bilaterally, no wheezing, rhonchi or rales Abd: soft, nontender, no hepatomegaly, no bruits Ext: no edema, 2+ distal pulses Skin: warm and dry GU: deferred Neuro: no focal abnormalities noted, AAO x 3  EKG:  10/21/14-sinus rhythm, 78, left axis deviation.    Labs: 12/14/13-creatinine 0.7, BUN 14, sodium 137, potassium 4.1 (previously 2.7 on chlorthalidone)  ASSESSMENT AND PLAN:  1. Uncontrolled hypertension-renal ultrasound unremarkable in 2014. She does not have any clinical symptoms suggestive of pheochromocytoma.  Her hypertension seems to have been long-standing since her 39s. She does remember in [place] having success with atenolol and lisinopril HCT. I will stop her labetalol, start atenolol 50 mg once a day, start lisinopril 20 mg/HCT 12.5 mg and continue spironolactone 25 mg. We will check a basic metabolic profile upon return. When she was on chlorthalidone alone, she developed hypokalemia.  Hopefully with low-dose HCTZ in combination with spironolactone, her potassium will be normal. Normally hypokalemia in the setting of no medication sometimes leads [place] to hyperaldosteronism We could try minoxidil in the future, vasodilator if necessary. Carvedilol is also an option. I do not hear any murmurs or bruits on exam suggestive of coarctation. We have discussed the risks of uncontrolled hypertension, stroke, renal impairment, heart disease etc. Avoid excessive NSAID use. 2. Tobacco use-encouraged cessation. 3. We will see her back in 2-4 weeks.  Signed, Candee Furbish, MD [facility]  10/21/2014 2:24 PM

## 2014-10-21 NOTE — Patient Instructions (Signed)
Please stop Labetalol. Start Atenolol 50 mg once a day. Start Lisinopril/HCTZ 20/12.5 mg a day. Continue all other medications as listed.  Follow up with Dr Marlou Porch in 2 to 4 weeks.

## 2014-11-06 ENCOUNTER — Encounter: Payer: Self-pay | Admitting: *Deleted

## 2014-11-07 ENCOUNTER — Ambulatory Visit: Payer: BC Managed Care – PPO | Admitting: Cardiology

## 2014-11-29 ENCOUNTER — Ambulatory Visit: Payer: BC Managed Care – PPO | Admitting: Cardiology

## 2014-12-03 ENCOUNTER — Encounter: Payer: Self-pay | Admitting: Cardiology

## 2015-02-04 ENCOUNTER — Other Ambulatory Visit: Payer: Self-pay | Admitting: Family Medicine

## 2015-10-11 ENCOUNTER — Other Ambulatory Visit: Payer: Self-pay | Admitting: Cardiology

## 2015-12-08 ENCOUNTER — Encounter: Payer: Self-pay | Admitting: Cardiology

## 2015-12-08 ENCOUNTER — Ambulatory Visit (INDEPENDENT_AMBULATORY_CARE_PROVIDER_SITE_OTHER): Payer: BLUE CROSS/BLUE SHIELD | Admitting: Cardiology

## 2015-12-08 VITALS — BP 126/70 | HR 67 | Ht 64.5 in | Wt 170.0 lb

## 2015-12-08 DIAGNOSIS — F4321 Adjustment disorder with depressed mood: Secondary | ICD-10-CM | POA: Diagnosis not present

## 2015-12-08 DIAGNOSIS — I10 Essential (primary) hypertension: Secondary | ICD-10-CM

## 2015-12-08 DIAGNOSIS — R0789 Other chest pain: Secondary | ICD-10-CM

## 2015-12-08 DIAGNOSIS — Z720 Tobacco use: Secondary | ICD-10-CM | POA: Diagnosis not present

## 2015-12-08 NOTE — Progress Notes (Signed)
Cardiology Office Note    Date:  12/08/2015   ID:  Lauren Blackburn, DOB 03-Oct-1976, MRN [id number]  PCP:  Simona Huh, MD  Cardiologist:   Candee Furbish, MD     History of Present Illness:  Lauren Blackburn is a 40 y.o. female here for uncontrolled hypertension follow-up.  Prior renal duplex was normal in 2014, creatinine was also normal, smoker.  Previous medications listed under allergies: - amlodipine causing confusion,  - Bystolic causing tongue swelling,  - chlorthalidone causing hypokalemia,  - metoprolol succinate causing fatigue,  - triamterene hydrochlorothiazide-ineffective for blood pressure, sulfa-hives.  Atenolol and lisinopril HCT was taken in [place]. BP was low at the time. This combination seemed to work she states. She was taken off of lisinopril previously because of pregnancy.  She has not been seen, did not come back for 2 week follow-up. On 11/11/15 she saw her primary physician, Dr. [name] who recommended that she return.  She has not had any clinical signs suggestive of pheochromocytoma. Hypertension is been long-standing since her 58s. She remembers prior success with atenolol and lisinopril HCT. Hypokalemia sometimes suggest hyperaldosteronism. No evidence of coarctation on exam. No bruits.  Possible future options include carvedilol, minoxidil.  Atenolol 50 mg, lisinopril hydrochlorothiazide 20/12.5 mg, spironolactone 25 mg were refilled by him recently.  GERD - acid reflux. Weight gain. Atypical chest pain because of this. SOB - feels like need to breath deeper. No exertional CP.    [name]. Cancer 23 brain tumors. Melanoma. He frequented cardiac rehabilitation. Was friends with [facility]. Allergic to cats.     Past Medical History  Diagnosis Date  . Hypertension     Past Surgical History  Procedure Laterality Date  . No surgical hx      Outpatient Prescriptions Prior to Visit  Medication Sig Dispense Refill  . atenolol  (TENORMIN) 50 MG tablet TAKE ONE TABLET BY MOUTH ONE TIME DAILY 30 tablet 0  . lisinopril-hydrochlorothiazide (PRINZIDE,ZESTORETIC) 20-12.5 MG tablet TAKE ONE TABLET BY MOUTH ONE TIME DAILY 30 tablet 0  . spironolactone (ALDACTONE) 25 MG tablet Take 25 mg by mouth once.     Marland Kitchen ibuprofen (ADVIL,MOTRIN) 200 MG tablet Take 200 mg by mouth every 6 (six) hours as needed. For pain    . loratadine (CLARITIN) 10 MG tablet Take 10 mg by mouth daily as needed. For allergies    . Prenatal Vit-Fe Fumarate-FA (PRENATAL MULTIVITAMIN) TABS Take 1 tablet by mouth daily.     No facility-administered medications prior to visit.     Allergies:   Amlodipine besylate; Chlorthalidone; Metoprolol; Nebivolol hcl; Sulfa antibiotics; Triamterene; and Influenza vac split quad   Social History   Social History  . Marital Status: Married    Spouse Name: N/A  . Number of Children: N/A  . Years of Education: N/A   Social History Main Topics  . Smoking status: Former Research scientist (life sciences)  . Smokeless tobacco: None  . Alcohol Use: Yes     Comment: stopped when found out pregnant prior to that glass or 2 of wine per day  . Drug Use: No  . Sexual Activity: Yes    Birth Control/ Protection: None   Other Topics Concern  . None   Social History Narrative     Family History:  The patient'sfamily history includes Hypertension in her father.   ROS:   Please see the history of present illness.    ROS All other systems reviewed and are negative.   PHYSICAL EXAM:  VS:  BP 126/70 mmHg  Pulse 67  Ht 5' 4.5" (1.638 m)  Wt 170 lb (77.111 kg)  BMI 28.74 kg/m2   GEN: Well nourished, well developed, in no acute distress HEENT: normal Neck: no JVD, carotid bruits, or masses Cardiac: RRR; no murmurs, rubs, or gallops,no edema  Respiratory:  clear to auscultation bilaterally, normal work of breathing GI: soft, nontender, nondistended, + BS MS: no deformity or atrophy Skin: warm and dry, no rash Neuro:  Alert and Oriented x 3,  Strength and sensation are intact Psych: euthymic mood, full affect  Wt Readings from Last 3 Encounters:  12/08/15 170 lb (77.111 kg)  10/21/14 163 lb (73.936 kg)  04/14/12 156 lb 9.6 oz (71.033 kg)      Studies/Labs Reviewed:   EKG:  EKG is ordered today.  12/08/15- sinus rhythm, 67, possible left atrial enlargement, poor RV progression, personally viewed-  prior 10/21/14 shows sinus rhythm, borderline LVH, no other abnormalities personally viewed  Recent Labs: No results found for requested labs within last 365 days.   Lipid Panel No results found for: CHOL, TRIG, HDL, CHOLHDL, VLDL, LDLCALC, LDLDIRECT  Additional studies/ records that were reviewed today include:   Previous EKG   ASSESSMENT:    1. Uncontrolled hypertension   2. Tobacco abuse   3. Atypical chest pain   4. Grief      PLAN:  In order of problems listed above:  1. Hypertension - try to avoid NSAIDs, ibuprofen was listed on her medication list. Previous secondary workup, renal ultrasound/duplex for instance was unremarkable. Her blood pressure now in clinic is excellent. She does report however that she has occasional high blood pressures at home. I asked her to keep a log of this and then she will return to clinic in approximately 2 months. She understands that this does increase her risk of stroke, MI. 2. Atypical chest pain-we will check an echocardiogram. Likely GERD. He careful attention to possibility of left ventricular hypertrophy. 3. Tobacco use-encouraged cessation. 4. Grieving-Don her father-in-law, patient of mine, died with melanoma. Her husband, his son found out that his mother in [place] had 2 sisters that possibly had fatal arrhythmias. His mother died suddenly. I recommended potential consultation with electrophysiology for evaluation.    Medication Adjustments/Labs and Tests Ordered: Current medicines are reviewed at length with the patient today.  Concerns regarding medicines are outlined  above.  Medication changes, Labs and Tests ordered today are listed in the Patient Instructions below. Patient Instructions  Medication Instructions:   CONTINUE ON SAME MEDICATIONS  If you need a refill on your cardiac medications before your next appointment, please call your pharmacy.  Labwork:  NONE ORDER TODAY   Testing/Procedures: Your physician has requested that you have an echocardiogram. Echocardiography is a painless test that uses sound waves to create images of your heart. It provides your doctor with information about the size and shape of your heart and how well your heart's chambers and valves are working. This procedure takes approximately one hour. There are no restrictions for this procedure.   Follow-Up: IN 2 MONTHS WITH DR Marlou Porch   Any Other Special Instructions Will Be Listed Below (If Applicable).  Bobby Rumpf, MD  12/08/2015 9:11 AM    [address], [place], [place]  [address] Phone: [phone]; Fax: [phone]

## 2015-12-08 NOTE — Patient Instructions (Addendum)
Medication Instructions:   CONTINUE ON SAME MEDICATIONS  If you need a refill on your cardiac medications before your next appointment, please call your pharmacy.  Labwork:  NONE ORDER TODAY   Testing/Procedures: Your physician has requested that you have an echocardiogram. Echocardiography is a painless test that uses sound waves to create images of your heart. It provides your doctor with information about the size and shape of your heart and how well your heart's chambers and valves are working. This procedure takes approximately one hour. There are no restrictions for this procedure.   Follow-Up: IN 2 MONTHS WITH DR Marlou Porch   Any Other Special Instructions Will Be Listed Below (If Applicable).

## 2015-12-18 ENCOUNTER — Other Ambulatory Visit (HOSPITAL_COMMUNITY): Payer: BLUE CROSS/BLUE SHIELD

## 2015-12-31 ENCOUNTER — Other Ambulatory Visit: Payer: Self-pay | Admitting: Family Medicine

## 2015-12-31 DIAGNOSIS — R3129 Other microscopic hematuria: Secondary | ICD-10-CM

## 2015-12-31 DIAGNOSIS — R1030 Lower abdominal pain, unspecified: Secondary | ICD-10-CM

## 2016-01-01 ENCOUNTER — Ambulatory Visit
Admission: RE | Admit: 2016-01-01 | Discharge: 2016-01-01 | Disposition: A | Discharge status: Home / Self Care | Payer: BLUE CROSS/BLUE SHIELD | Source: Ambulatory Visit | Attending: Family Medicine | Admitting: Family Medicine

## 2016-01-01 DIAGNOSIS — R1030 Lower abdominal pain, unspecified: Secondary | ICD-10-CM

## 2016-01-01 DIAGNOSIS — R3129 Other microscopic hematuria: Secondary | ICD-10-CM

## 2016-01-05 ENCOUNTER — Other Ambulatory Visit: Payer: Self-pay | Admitting: Family Medicine

## 2016-01-05 DIAGNOSIS — R935 Abnormal findings on diagnostic imaging of other abdominal regions, including retroperitoneum: Secondary | ICD-10-CM

## 2016-01-09 ENCOUNTER — Ambulatory Visit
Admission: RE | Admit: 2016-01-09 | Discharge: 2016-01-09 | Disposition: A | Discharge status: Home / Self Care | Payer: No Typology Code available for payment source | Source: Ambulatory Visit | Attending: Family Medicine | Admitting: Family Medicine

## 2016-01-09 DIAGNOSIS — R935 Abnormal findings on diagnostic imaging of other abdominal regions, including retroperitoneum: Secondary | ICD-10-CM

## 2016-01-09 MED ORDER — IOPAMIDOL (ISOVUE-300) INJECTION 61%
100.0000 mL | Freq: Once | INTRAVENOUS | Status: AC | PRN
  Administered 2016-01-09: 100 mL via INTRAVENOUS

## 2016-02-09 ENCOUNTER — Ambulatory Visit: Payer: BLUE CROSS/BLUE SHIELD | Admitting: Cardiology

## 2016-03-10 ENCOUNTER — Ambulatory Visit (HOSPITAL_COMMUNITY): Payer: BLUE CROSS/BLUE SHIELD

## 2016-03-17 ENCOUNTER — Ambulatory Visit: Payer: BLUE CROSS/BLUE SHIELD | Admitting: Cardiology

## 2016-05-11 ENCOUNTER — Telehealth: Payer: Self-pay | Admitting: Cardiology

## 2016-05-11 MED ORDER — SPIRONOLACTONE 25 MG PO TABS: 25.0000 mg | ORAL_TABLET | Freq: Once | ORAL | Status: DC

## 2016-05-11 NOTE — Telephone Encounter (Signed)
returned pt phone call, LVM to call back.

## 2016-05-11 NOTE — Telephone Encounter (Signed)
*  STAT* If patient is at the pharmacy, call can be transferred to refill team. Has appt w/ Skains 8/3. Please call back and discuss.    1. Which medications need to be refilled? (please list name of each medication and dose if known) spironolactone 25 mg  2. Which pharmacy/location (including street and city if local pharmacy) is medication to be sent to? [facility] [address]   3. Do they need a 30 day or 90 day supply? [place]

## 2016-06-21 ENCOUNTER — Other Ambulatory Visit (HOSPITAL_COMMUNITY): Payer: Self-pay

## 2016-06-21 ENCOUNTER — Ambulatory Visit (HOSPITAL_COMMUNITY): Payer: BLUE CROSS/BLUE SHIELD | Attending: Cardiovascular Disease

## 2016-06-21 DIAGNOSIS — R079 Chest pain, unspecified: Secondary | ICD-10-CM | POA: Diagnosis present

## 2016-06-21 DIAGNOSIS — R0789 Other chest pain: Secondary | ICD-10-CM | POA: Diagnosis not present

## 2016-06-21 DIAGNOSIS — I313 Pericardial effusion (noninflammatory): Secondary | ICD-10-CM | POA: Insufficient documentation

## 2016-06-21 LAB — ECHOCARDIOGRAM COMPLETE
CHL CUP MV DEC (S): 208
CHL CUP RV SYS PRESS: 8 mmHg
E decel time: 208 msec
E/e' ratio: 7.02
FS: 37 % (ref 28–44)
IV/PV OW: 0.7
LA ID, A-P, ES: 35 mm
LA diam index: 1.85 cm/m2
LA vol A4C: 39.6 ml
LA vol index: 24.4 mL/m2
LAVOL: 46.3 mL
LDCA: 2.84 cm2
LEFT ATRIUM END SYS DIAM: 35 mm
LV TDI E'LATERAL: 12.1
LV TDI E'MEDIAL: 8.81
LVEEAVG: 7.02
LVEEMED: 7.02
LVELAT: 12.1 cm/s
LVOT VTI: 23.8 cm
LVOT diameter: 19 mm
LVOT peak grad rest: 5 mmHg
LVOTPV: 115 cm/s
LVOTSV: 68 mL
Lateral S' vel: 12.1 cm/s
MV pk A vel: 64.5 m/s
MV pk E vel: 84.9 m/s
MVPG: 3 mmHg
PW: 12.8 mm — AB (ref 0.6–1.1)
Reg peak vel: 110 cm/s
TAPSE: 18.4 mm
TRMAXVEL: 110 cm/s
TVPG: 110 mmHg

## 2016-06-24 ENCOUNTER — Ambulatory Visit (INDEPENDENT_AMBULATORY_CARE_PROVIDER_SITE_OTHER): Payer: BLUE CROSS/BLUE SHIELD | Admitting: Cardiology

## 2016-06-24 ENCOUNTER — Telehealth: Payer: Self-pay | Admitting: *Deleted

## 2016-06-24 ENCOUNTER — Encounter: Payer: Self-pay | Admitting: Cardiology

## 2016-06-24 VITALS — BP 136/84 | HR 60 | Ht 64.5 in | Wt 177.8 lb

## 2016-06-24 DIAGNOSIS — Z720 Tobacco use: Secondary | ICD-10-CM | POA: Diagnosis not present

## 2016-06-24 DIAGNOSIS — R0789 Other chest pain: Secondary | ICD-10-CM | POA: Diagnosis not present

## 2016-06-24 DIAGNOSIS — Z79899 Other long term (current) drug therapy: Secondary | ICD-10-CM

## 2016-06-24 DIAGNOSIS — I10 Essential (primary) hypertension: Secondary | ICD-10-CM

## 2016-06-24 LAB — COMPREHENSIVE METABOLIC PANEL
ALBUMIN: 4.4 g/dL (ref 3.6–5.1)
ALK PHOS: 20 U/L — AB (ref 33–115)
ALT: 15 U/L (ref 6–29)
AST: 18 U/L (ref 10–30)
BUN: 17 mg/dL (ref 7–25)
CO2: 27 mmol/L (ref 20–31)
CREATININE: 0.98 mg/dL (ref 0.50–1.10)
Calcium: 9.8 mg/dL (ref 8.6–10.2)
Chloride: 103 mmol/L (ref 98–110)
Glucose, Bld: 79 mg/dL (ref 65–99)
POTASSIUM: 4.2 mmol/L (ref 3.5–5.3)
Sodium: 139 mmol/L (ref 135–146)
TOTAL PROTEIN: 7.4 g/dL (ref 6.1–8.1)
Total Bilirubin: 0.5 mg/dL (ref 0.2–1.2)

## 2016-06-24 MED ORDER — SPIRONOLACTONE 25 MG PO TABS: 25.0000 mg | ORAL_TABLET | Freq: Once | ORAL | 3 refills | Status: DC

## 2016-06-24 MED ORDER — LISINOPRIL-HYDROCHLOROTHIAZIDE 20-12.5 MG PO TABS: 1.0000 | ORAL_TABLET | Freq: Every day | ORAL | 3 refills | Status: DC

## 2016-06-24 MED ORDER — ATENOLOL 50 MG PO TABS: 50.0000 mg | ORAL_TABLET | Freq: Every day | ORAL | 3 refills | Status: DC

## 2016-06-24 NOTE — Telephone Encounter (Signed)
Pt notified of both echo and lab results by phone with verbal understanding to all results given today.

## 2016-06-24 NOTE — Patient Instructions (Signed)
Medication Instructions:  The current medical regimen is effective;  continue present plan and medications.  Labwork: Please have blood work today (CMP).  Follow-Up: Follow up in 6 months with Dr. Marlou Porch.  You will receive a letter in the mail 2 months before you are due.  Please call us when you receive this letter to schedule your follow up appointment.  If you need a refill on your cardiac medications before your next appointment, please call your pharmacy.  Thank you for choosing [facility]!!

## 2016-06-24 NOTE — Progress Notes (Signed)
Cardiology Office Note    Date:  06/24/2016   ID:  [name], DOB 11/04/1976, MRN [id number]  PCP:  Simona Huh, MD  Cardiologist:   Candee Furbish, MD     History of Present Illness:  Lauren Blackburn is a 40 y.o. female here for uncontrolled hypertension follow-up.  Prior renal duplex was normal in 2014, creatinine was also normal, smoker.  Previous medications listed under allergies: - amlodipine causing confusion,  - Bystolic causing tongue swelling,  - chlorthalidone causing hypokalemia,  - metoprolol succinate causing fatigue,  - triamterene hydrochlorothiazide-ineffective for blood pressure, sulfa-hives.  Atenolol and lisinopril HCT was taken in [place]. BP was low at the time. This combination seemed to work she states. She was taken off of lisinopril previously because of pregnancy.  She has not been seen, did not come back for 2 week follow-up. On 11/11/15 she saw her primary physician, Dr. [name] who recommended that she return.  She has not had any clinical signs suggestive of pheochromocytoma. Hypertension is been long-standing since her 43s. She remembers prior success with atenolol and lisinopril HCT. Hypokalemia sometimes suggest hyperaldosteronism. No evidence of coarctation on exam. No bruits.  Possible future options include carvedilol, minoxidil.  Atenolol 50 mg, lisinopril hydrochlorothiazide 20/12.5 mg, spironolactone 25 mg were refilled by him recently.  GERD - acid reflux. Weight gain. Atypical chest pain because of this. SOB - feels like need to breath deeper. No exertional CP.    [name]. Cancer 23 brain tumors. Melanoma. He frequented cardiac rehabilitation. Was friends with [name]. Allergic to cats.     Past Medical History:  Diagnosis Date  . Hypertension     Past Surgical History:  Procedure Laterality Date  . no surgical hx      Outpatient Medications Prior to Visit  Medication Sig Dispense Refill  . cetirizine  (ZYRTEC) 10 MG tablet Take 10 mg by mouth daily.    . MULTIPLE VITAMIN PO Take 1 tablet by mouth daily.    Marland Kitchen atenolol (TENORMIN) 50 MG tablet TAKE ONE TABLET BY MOUTH ONE TIME DAILY 30 tablet 0  . lisinopril-hydrochlorothiazide (PRINZIDE,ZESTORETIC) 20-12.5 MG tablet TAKE ONE TABLET BY MOUTH ONE TIME DAILY 30 tablet 0  . spironolactone (ALDACTONE) 25 MG tablet Take 1 tablet (25 mg total) by mouth once. 90 tablet 1   No facility-administered medications prior to visit.      Allergies:   Amlodipine besylate; Chlorthalidone; Metoprolol; Nebivolol hcl; Sulfa antibiotics; Triamterene; and Influenza vac split quad   Social History   Social History  . Marital status: Married    Spouse name: N/A  . Number of children: N/A  . Years of education: N/A   Social History Main Topics  . Smoking status: Former Research scientist (life sciences)  . Smokeless tobacco: None  . Alcohol use Yes     Comment: stopped when found out pregnant prior to that glass or 2 of wine per day  . Drug use: No  . Sexual activity: Yes    Birth control/ protection: None   Other Topics Concern  . None   Social History Narrative  . None     Family History:  The patient'sfamily history includes Hypertension in her father.   ROS:   Please see the history of present illness.    ROS All other systems reviewed and are negative.   PHYSICAL EXAM:   VS:  BP 136/84   Pulse 60   Ht 5' 4.5" (1.638 m)   Wt 177 lb 12.8 oz (  80.6 kg)   LMP 06/14/2016   BMI 30.05 kg/m    GEN: Well nourished, well developed, in no acute distress HEENT: normal Neck: no JVD, carotid bruits, or masses Cardiac: RRR; no murmurs, rubs, or gallops,no edema  Respiratory:  clear to auscultation bilaterally, normal work of breathing GI: soft, nontender, nondistended, + BS MS: no deformity or atrophy Skin: warm and dry, no rash Neuro:  Alert and Oriented x 3, Strength and sensation are intact Psych: euthymic mood, full affect  Wt Readings from Last 3 Encounters:    06/24/16 177 lb 12.8 oz (80.6 kg)  12/08/15 170 lb (77.1 kg)  10/21/14 163 lb (73.9 kg)      Studies/Labs Reviewed:   EKG:  EKG is ordered today.  12/08/15- sinus rhythm, 67, possible left atrial enlargement, poor RV progression, personally viewed-  prior 10/21/14 shows sinus rhythm, borderline LVH, no other abnormalities personally viewed  Recent Labs: No results found for requested labs within last 8760 hours.   Lipid Panel No results found for: CHOL, TRIG, HDL, CHOLHDL, VLDL, LDLCALC, LDLDIRECT  Additional studies/ records that were reviewed today include:   Previous EKG  ECHO 06/21/16: - Left ventricle: The cavity size was normal. Wall thickness was   normal. Systolic function was normal. The estimated ejection   fraction was in the range of 55% to 60%. Wall motion was normal;   there were no regional wall motion abnormalities. Left   ventricular diastolic function parameters were normal. - Pericardium, extracardiac: A trivial pericardial effusion was   identified.   ASSESSMENT:    1. Essential hypertension   2. Atypical chest pain   3. Tobacco abuse   4. Encounter for long-term (current) use of medications      PLAN:  In order of problems listed above:  1. Hypertension -Blood pressure well controlled currently on this regimen. She reminded me that she was diagnosed with hypertension when she weighed 120 pounds. Try to avoid NSAIDs, ibuprofen was listed on her medication list. Previous secondary workup, renal ultrasound/duplex for instance was unremarkable. Her blood pressure now in clinic is excellent.  She understands that this does increase her risk of stroke, MI. We reviewed echocardiogram results with her. Reassuring. No evidence of left ventricular hypertrophy. 2. Atypical chest pain- echocardiogram reassuring normal EF, normal function. Likely GERD. No left ventricular hypertrophy. 3. Tobacco use-encouraged cessation. She and her husband are going to try to quit  surrounding her upcoming vacation. She has quit in the past. 4. Grieving-Don her father-in-law, patient of mine, died with melanoma. Her husband, his son found out that his mother in [place] had 2 sisters that possibly had fatal arrhythmias. His mother died suddenly. I recommended potential consultation with electrophysiology for evaluation.    Medication Adjustments/Labs and Tests Ordered: Current medicines are reviewed at length with the patient today.  Concerns regarding medicines are outlined above.  Medication changes, Labs and Tests ordered today are listed in the Patient Instructions below. Patient Instructions  Medication Instructions:  The current medical regimen is effective;  continue present plan and medications.  Labwork: Please have blood work today (CMP).  Follow-Up: Follow up in 6 months with Dr. Marlou Porch.  You will receive a letter in the mail 2 months before you are due.  Please call us when you receive this letter to schedule your follow up appointment.  If you need a refill on your cardiac medications before your next appointment, please call your pharmacy.  Thank you for choosing [facility]!!  Signed, Candee Furbish, MD  06/24/2016 8:57 AM    [facility] [address], [place], [place]  [address] Phone: [phone]; Fax: [phone]

## 2016-08-12 IMAGING — CT CT ABD-PELV W/O CM
2 of 4 series · 14 of 36 positions shown, 19 images · non-contrast
Comparison: None.

CLINICAL DATA: Intermittent pelvic pain, most recent on 12/28/2015.
Microhematuria.

EXAM:
CT ABDOMEN AND PELVIS WITHOUT CONTRAST
TECHNIQUE: Multidetector CT imaging of the abdomen and pelvis was performed
following the standard protocol without IV contrast.

[Series 601: coronal body · coronal · 0.89mm/px · 1 of 117 slices shown, 2 images]
[im 39/117  soft-tissue]
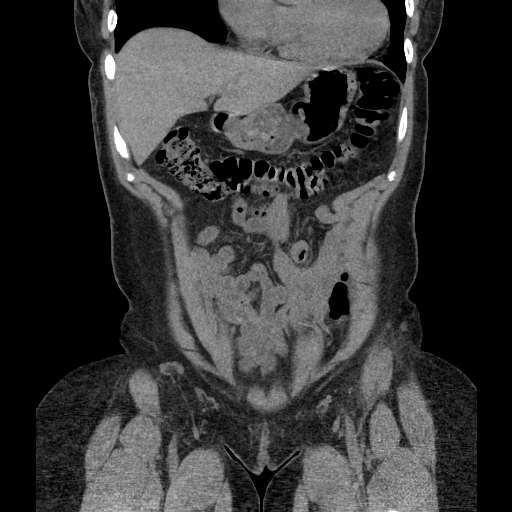
[im 39/117  bone]
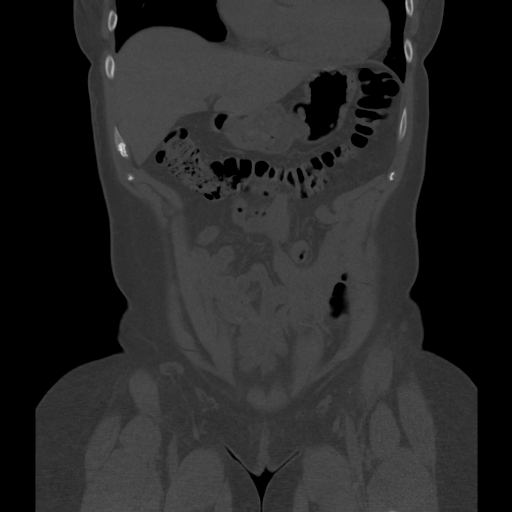

[Series 602: sagittal body · sagittal · 0.89mm/px · 13 of 161 slices shown, 17 images]
[im 9/161  soft-tissue]
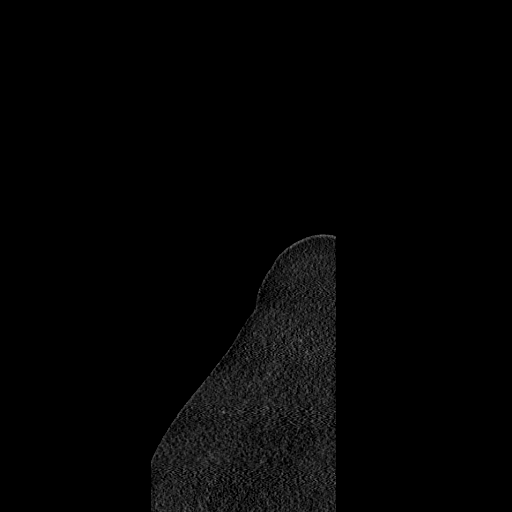
[im 9/161  lung]
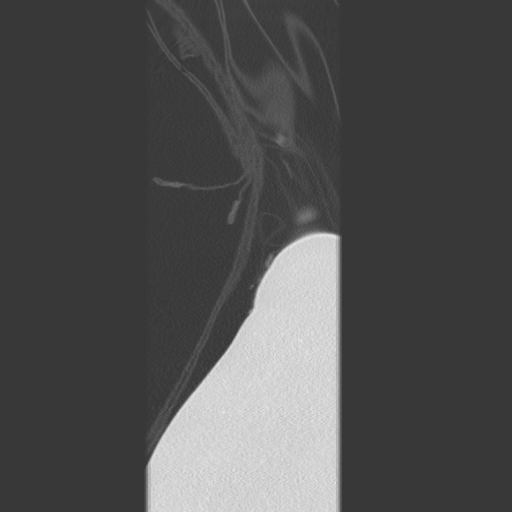
[im 9/161  bone]
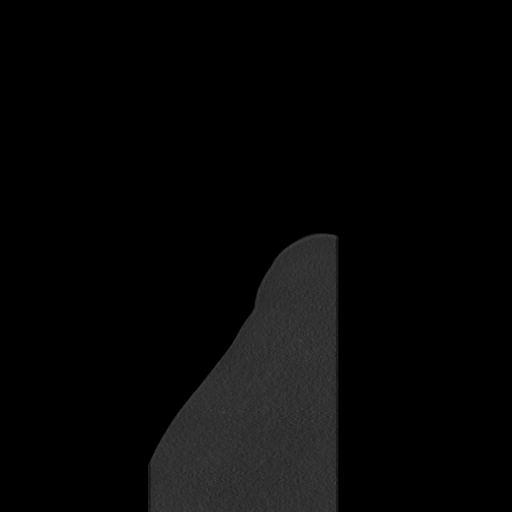
[im 17/161  lung]
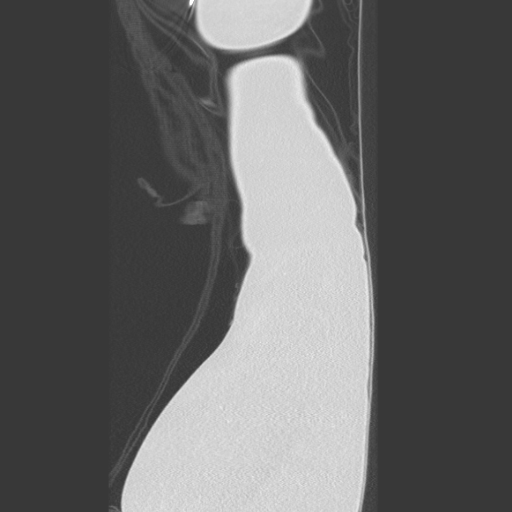
[im 26/161  soft-tissue]
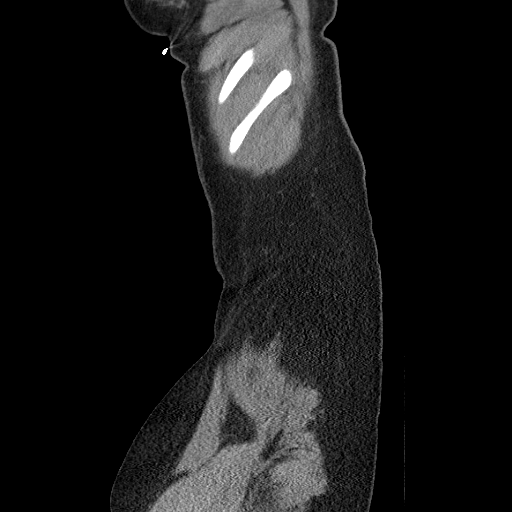
[im 26/161  lung]
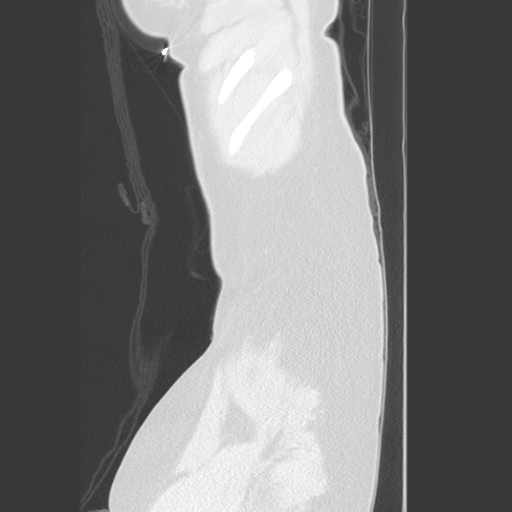
[im 34/161  lung]
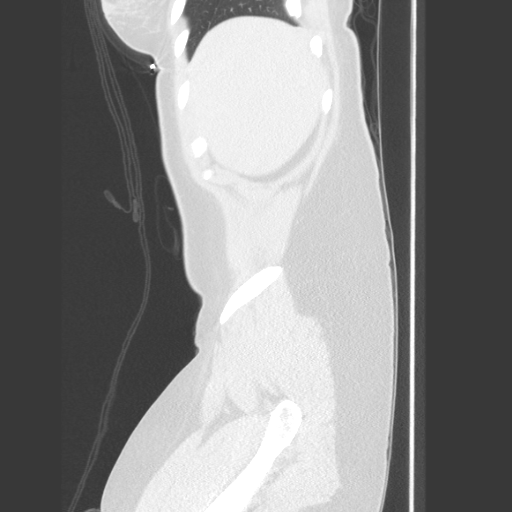
[im 43/161  soft-tissue]
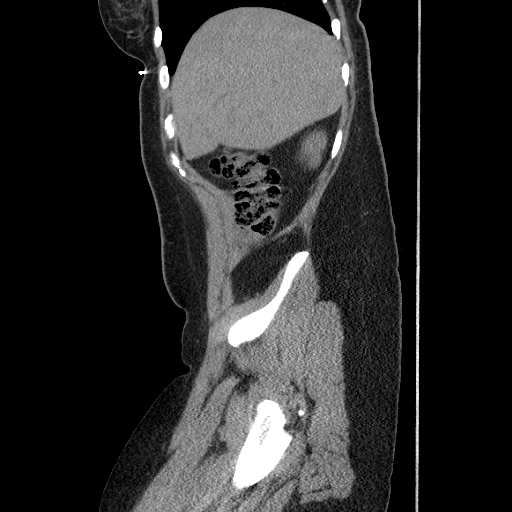
[im 51/161  soft-tissue]
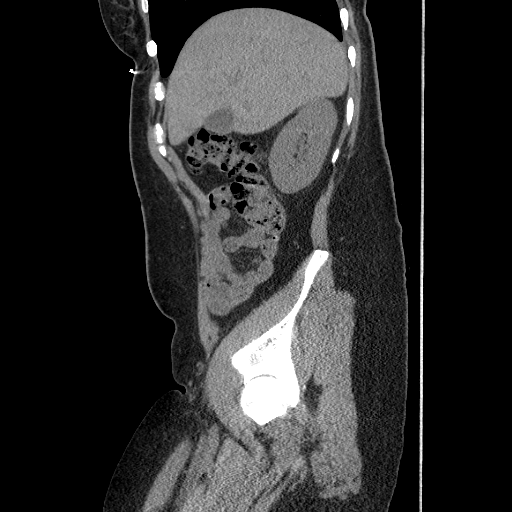
[im 68/161  soft-tissue]
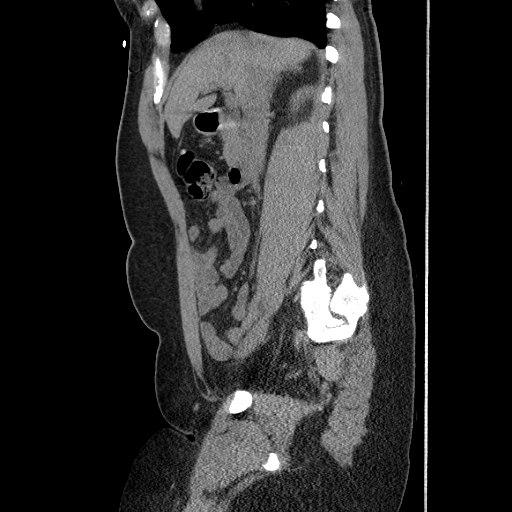
[im 85/161  soft-tissue]
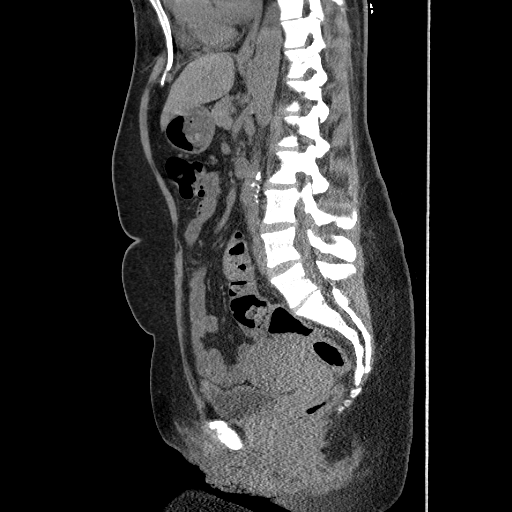
[im 93/161  soft-tissue]
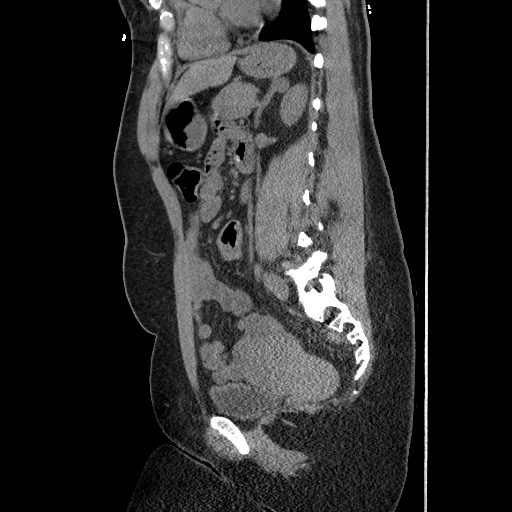
[im 110/161  soft-tissue]
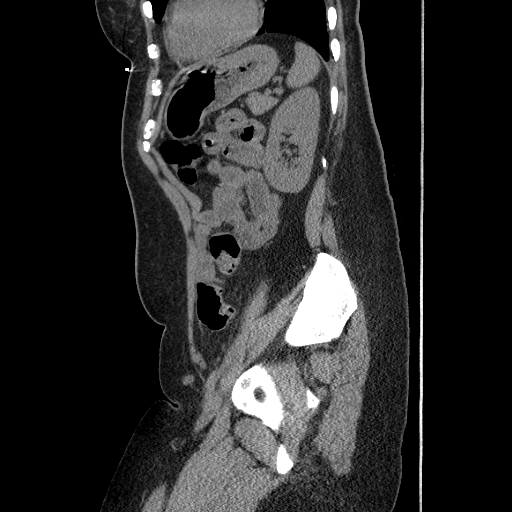
[im 118/161  soft-tissue]
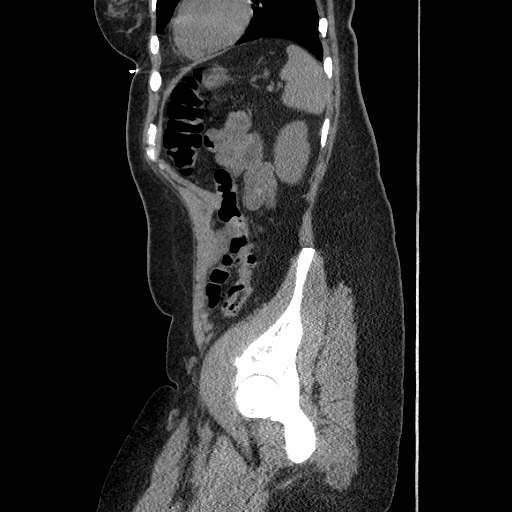
[im 118/161  bone]
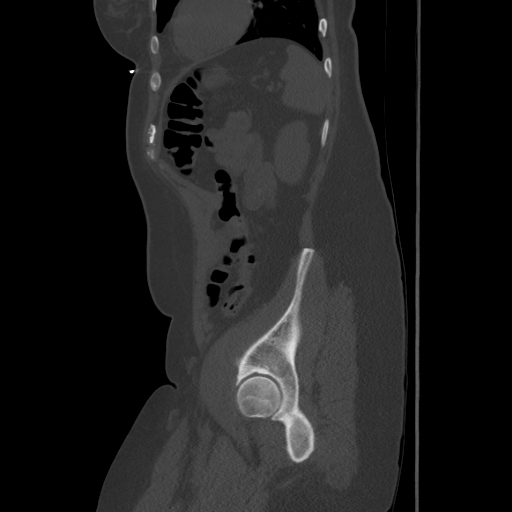
[im 135/161  soft-tissue]
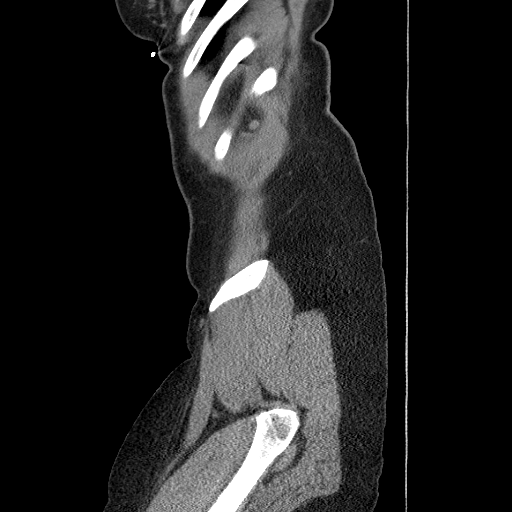
[im 152/161  soft-tissue]
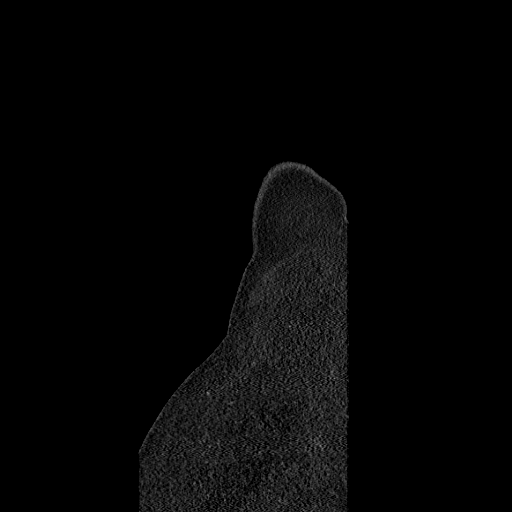

[14 of 36 positions shown; findings below may reference images not displayed]

FINDINGS: Lower chest:  No acute findings.

Hepatobiliary: No mass visualized on this un-enhanced exam.

Pancreas: No mass or inflammatory process identified on this
un-enhanced exam.

Spleen: Within normal limits in size.

Adrenals/Urinary Tract: No evidence of urolithiasis or
hydronephrosis. No definite renal mass visualized on this
un-enhanced exam. There is a 17 mm left adrenal mass, off of the
medial leaflet of the gland, measuring 23 Hounsfield units in
density.

Stomach/Bowel: No evidence of obstruction, inflammatory process, or
abnormal fluid collections. The appendix is normal.

Vascular/Lymphatic: No pathologically enlarged lymph nodes. No
evidence of abdominal aortic aneurysm.

Reproductive: No mass or other significant abnormality.

Other: None.

Musculoskeletal:  No suspicious bone lesions identified.
IMPRESSION: No evidence of obstructive uropathy.

17 mm left adrenal mass. This may represent a lipid poor adrenal
adenoma, however malignancy cannot be excluded. Follow-up with CT of
the abdomen, adrenal protocol is recommended.

## 2016-12-21 ENCOUNTER — Encounter (INDEPENDENT_AMBULATORY_CARE_PROVIDER_SITE_OTHER): Payer: Self-pay | Admitting: Physical Medicine and Rehabilitation

## 2016-12-21 ENCOUNTER — Ambulatory Visit (INDEPENDENT_AMBULATORY_CARE_PROVIDER_SITE_OTHER): Payer: BLUE CROSS/BLUE SHIELD | Admitting: Physical Medicine and Rehabilitation

## 2016-12-21 ENCOUNTER — Ambulatory Visit (INDEPENDENT_AMBULATORY_CARE_PROVIDER_SITE_OTHER): Payer: BLUE CROSS/BLUE SHIELD

## 2016-12-21 VITALS — BP 169/109 | HR 79

## 2016-12-21 DIAGNOSIS — M25511 Pain in right shoulder: Secondary | ICD-10-CM

## 2016-12-21 DIAGNOSIS — R202 Paresthesia of skin: Secondary | ICD-10-CM

## 2016-12-21 DIAGNOSIS — M5412 Radiculopathy, cervical region: Secondary | ICD-10-CM | POA: Diagnosis not present

## 2016-12-21 DIAGNOSIS — M542 Cervicalgia: Secondary | ICD-10-CM

## 2016-12-21 DIAGNOSIS — G8929 Other chronic pain: Secondary | ICD-10-CM | POA: Diagnosis not present

## 2016-12-21 MED ORDER — PREDNISONE 50 MG PO TABS: ORAL_TABLET | ORAL | 0 refills | Status: DC

## 2016-12-21 MED ORDER — METHOCARBAMOL 500 MG PO TABS: 500.0000 mg | ORAL_TABLET | Freq: Three times a day (TID) | ORAL | 0 refills | Status: DC | PRN

## 2016-12-21 NOTE — Progress Notes (Signed)
Lauren Blackburn - 41 y.o. female MRN [id number]  Date of birth: [birth year]  Office Visit Note: Visit Date: 12/21/2016 PCP: Simona Huh, MD Referred by: Gaynelle Arabian, MD  Subjective: Chief Complaint  Patient presents with  . Neck - Pain  . Right Shoulder - Pain   HPI: Lauren Blackburn is a 41 year old female with chronic history of neck pain with one episode a year so ago that resolved really with exercises and anti-inflammatory. She reports 2-3 week history of severe Pain right side of neck, right shoulder, goes down right arm into right thumb. Fingertips numb after laying on right side but this was nondermatomal only happened on one incident. She reports the pain is really midline to the right at about the C7 spinous process with referral over into the lateral shoulder and she feels like she gets a numbness type feeling on the lateral aspect of the shoulder. She gets pain again down the arm into the hand and somewhat of a C6 distribution. She does do a lot of computer work. She's never been diagnosed with carpal tunnel syndrome. She has had the numbness and tingling is really nondermatomal and it was all the fingertips and it was only 1 incident. She's had no trauma or other injuries to the neck. She feels like she can't hold her head up at this point and feels like her muscles are very weak. She feels like her shoulder drops quite a bit to just because of the pain. She has not been to formal physical therapy. Her primary care physician is treated this with muscle relaxer which is Flexeril and she has taken 1-2 ibuprofen at times with no relief. She does have essential hypertension which has been hard to control. She sees Dr. stains for this. She has never had any cervical spine surgery or intervention such as injections. She's not had any associated headaches. She has not had any focal weakness on her arms felt weak. Her left hand is been fine although she has felt some tingling at times in the  left hand. She's had no fevers chills or night sweats or weight loss.     Review of Systems  Constitutional: Negative for chills, fever, malaise/fatigue and weight loss.  HENT: Negative for hearing loss and sinus pain.   Eyes: Negative for blurred vision, double vision and photophobia.  Respiratory: Negative for cough and shortness of breath.   Cardiovascular: Negative for chest pain, palpitations and leg swelling.  Gastrointestinal: Negative for abdominal pain, nausea and vomiting.  Genitourinary: Negative for flank pain.  Musculoskeletal: Positive for neck pain. Negative for myalgias.  Skin: Negative for itching and rash.  Neurological: Positive for tingling. Negative for tremors, focal weakness and weakness.  Endo/Heme/Allergies: Negative.   Psychiatric/Behavioral: Negative for depression.  All other systems reviewed and are negative.  Otherwise per HPI.  Assessment & Plan: Visit Diagnoses:  1. Neck pain on right side   2. Chronic right shoulder pain   3. Cervical radiculopathy   4. Paresthesia of skin     Plan: Findings:  Chronic history of neck pain with an episode 1 year ago that was fairly severe but resolved on its on with muscle relaxer and exercises and now with recurrent episode with really fairly severe axial pain referring into the shoulder and maybe down the arm. We can reproduce some of her pain with a focal trigger point in the levator scapula. She has no impingement signs of the shoulder. X-rays reveal some early spondylitic changes in the  cervical spine particularly at C5-6 and C6-7 but no focal area of bony stenosis. I think this is really mostly myofascial pain syndrome with trigger point levator scapula. It could be radicular in nature and she does get a referral pattern but these to mimic each other. I think the best approach right now is to stay conservative given the fact that this is only been going on now for a few weeks. I want to try her with 50 mg of  prednisone for 5 days as a potent anti-inflammatory. She's not a great candidate for anti-inflammatory long-term due to the hypertension. I also want to give her a different muscle relaxer which would be Robaxin. This may help her during the day whereas the Flexeril is gone make her sleepy. I'm also going to refer her to [facility] physical therapy for manual treatment as well as dry needling. We'll see her back in follow-up after that. I spent more than 30 minutes speaking face-to-face with the patient with 50% of the time in counseling.    Meds & Orders:  Meds ordered this encounter  Medications  . methocarbamol (ROBAXIN) 500 MG tablet    Sig: Take 1 tablet (500 mg total) by mouth every 8 (eight) hours as needed for muscle spasms.    Dispense:  60 tablet    Refill:  0  . predniSONE (DELTASONE) 50 MG tablet    Sig: Take 1 tablet daily with food for 5 days until finished    Dispense:  5 tablet    Refill:  0    Orders Placed This Encounter  Procedures  . XR Cervical Spine 2 or 3 views  . Ambulatory referral to Physical Therapy    Follow-up: Return in about 4 weeks (around 01/18/2017).   Procedures: No procedures performed  No notes on file   Clinical History: No specialty comments available.  She reports that she has quit smoking. She has never used smokeless tobacco. No results for input(s): HGBA1C, LABURIC in the last 8760 hours.  Objective:  VS:  HT:    WT:   BMI:     BP:(!) 169/109  HR:79bpm  TEMP: ( )  RESP:  Physical Exam  Constitutional: She is oriented to person, place, and time. She appears well-developed and well-nourished. No distress.  HENT:  Head: Normocephalic and atraumatic.  Nose: Nose normal.  Mouth/Throat: Oropharynx is clear and moist.  Eyes: Conjunctivae are normal. Pupils are equal, round, and reactive to light.  Neck: Neck supple.  Cardiovascular: Regular rhythm and intact distal pulses.   Pulmonary/Chest: Effort normal and breath sounds normal.    Abdominal: She exhibits no distension.  Musculoskeletal:  Cervical exam shows a forward flexed cervical spine with painful extension rotation. She has a focal trigger point levator scapula reproduces a lot of her pain even with a referral pattern in the arm. She has negative impingement signs of both shoulders. She has no pain over the biceps tendon. She has good strength in the upper extremities bilaterally without any deficit bilaterally. This included long finger flexion, finger abduction and wrist extension. She had a negative Phalen's test bilaterally. She had a negative Tinel's test.  Lymphadenopathy:    She has no cervical adenopathy.  Neurological: She is alert and oriented to person, place, and time.  Skin: Skin is warm. No rash noted. No erythema.  Psychiatric: She has a normal mood and affect. Her behavior is normal.  Nursing note and vitals reviewed.   Ortho Exam Imaging: Xr Cervical Spine  2 Or 3 Views  Result Date: 12/21/2016 AP lateral and oblique view of the cervical spine shows very mild cervical thoracic scoliosis with some uncovertebral joint hypertrophy more in the upper cervical region at C3-4 bilaterally maybe left more than right. Looking at the obliques is no foraminal narrowing. On the lateral view there is some reversal of lordosis but clinically she is holding her head in forward flexion. There is some endplate osteophytes at C5-6 and C6-7. There are no fractures or dislocations.   Past Medical/Family/Surgical/Social History: Medications & Allergies reviewed per EMR Patient Active Problem List   Diagnosis Date Noted  . Uncontrolled hypertension 10/21/2014  . Essential hypertension 10/21/2014  . Tobacco abuse 10/21/2014  . Benign essential HTN 09/11/2014   Past Medical History:  Diagnosis Date  . Hypertension    Family History  Problem Relation Age of Onset  . Hypertension Father    Past Surgical History:  Procedure Laterality Date  . no surgical hx      Social History   Occupational History  . Not on file.   Social History Main Topics  . Smoking status: Former Research scientist (life sciences)  . Smokeless tobacco: Never Used  . Alcohol use Yes     Comment: stopped when found out pregnant prior to that glass or 2 of wine per day  . Drug use: No  . Sexual activity: Yes    Birth control/ protection: None

## 2016-12-22 ENCOUNTER — Ambulatory Visit (INDEPENDENT_AMBULATORY_CARE_PROVIDER_SITE_OTHER): Payer: Self-pay | Admitting: Physical Medicine and Rehabilitation

## 2016-12-31 ENCOUNTER — Telehealth (INDEPENDENT_AMBULATORY_CARE_PROVIDER_SITE_OTHER): Payer: Self-pay

## 2016-12-31 NOTE — Telephone Encounter (Signed)
Patient left voicemail stating muscle relaxer is not helping with pain and she has tried Advil with no relief. Has been to couple sessions of PT which she thinks have helped some.  Wants to know if there is anything else you recommend?

## 2017-01-03 NOTE — Telephone Encounter (Signed)
Ask if she took the prednisone and if the PT is doing dry needling, if still not helping then OV

## 2017-01-03 NOTE — Telephone Encounter (Signed)
Left vm for pt to call back to discuss.

## 2017-01-03 NOTE — Telephone Encounter (Signed)
Patient did take prednisone and PT is doing dry needling. Neither has helped very much. Scheduled for an OV 01/06/17 at 0815.

## 2017-01-06 ENCOUNTER — Ambulatory Visit (INDEPENDENT_AMBULATORY_CARE_PROVIDER_SITE_OTHER): Payer: BLUE CROSS/BLUE SHIELD | Admitting: Physical Medicine and Rehabilitation

## 2017-01-07 ENCOUNTER — Telehealth (INDEPENDENT_AMBULATORY_CARE_PROVIDER_SITE_OTHER): Payer: Self-pay | Admitting: Physical Medicine and Rehabilitation

## 2017-01-07 MED ORDER — ACETAMINOPHEN-CODEINE #3 300-30 MG PO TABS: 1.0000 | ORAL_TABLET | Freq: Three times a day (TID) | ORAL | 0 refills | Status: DC | PRN

## 2017-01-07 NOTE — Telephone Encounter (Signed)
See call in note, rx of Tylenol #3 for pain until seen in office.  Has HTN.

## 2017-01-07 NOTE — Telephone Encounter (Signed)
Printed rx, please fax

## 2017-01-18 ENCOUNTER — Encounter (INDEPENDENT_AMBULATORY_CARE_PROVIDER_SITE_OTHER): Payer: Self-pay | Admitting: Physical Medicine and Rehabilitation

## 2017-01-18 ENCOUNTER — Ambulatory Visit (INDEPENDENT_AMBULATORY_CARE_PROVIDER_SITE_OTHER): Payer: BLUE CROSS/BLUE SHIELD | Admitting: Physical Medicine and Rehabilitation

## 2017-01-18 VITALS — BP 172/119 | HR 79

## 2017-01-18 DIAGNOSIS — M5412 Radiculopathy, cervical region: Secondary | ICD-10-CM | POA: Diagnosis not present

## 2017-01-18 DIAGNOSIS — M609 Myositis, unspecified: Secondary | ICD-10-CM

## 2017-01-18 DIAGNOSIS — M47812 Spondylosis without myelopathy or radiculopathy, cervical region: Secondary | ICD-10-CM

## 2017-01-18 DIAGNOSIS — M542 Cervicalgia: Secondary | ICD-10-CM | POA: Diagnosis not present

## 2017-01-18 MED ORDER — MELOXICAM 15 MG PO TABS
15.0000 mg | ORAL_TABLET | Freq: Every day | ORAL | 0 refills | Status: DC
Start: 2017-01-18 — End: 2017-09-21

## 2017-01-18 MED ORDER — TIZANIDINE HCL 4 MG PO CAPS: 4.0000 mg | ORAL_CAPSULE | Freq: Every day | ORAL | 0 refills | Status: DC

## 2017-01-18 MED ORDER — METHOCARBAMOL 500 MG PO TABS: 500.0000 mg | ORAL_TABLET | Freq: Three times a day (TID) | ORAL | 0 refills | Status: DC | PRN

## 2017-01-18 NOTE — Progress Notes (Signed)
Lauren Blackburn - 41 y.o. female MRN [id number]  Date of birth: 1976-08-11  Office Visit Note: Visit Date: 01/18/2017 PCP: Simona Huh, MD Referred by: Gaynelle Arabian, MD  Subjective: Chief Complaint  Patient presents with  . Neck - Pain   HPI: Lauren Blackburn is a 41 year old right-handed female that we saw at the end of January with worsening neck and shoulder and arm pain. Since that time she has been followed by [facility] and has completed around 8 sessions. She's had dry needling. She feels like overall she is a lot stronger than when she started in overall she is doing better than when she first saw [place] at the end of January. He still has some episodes of pretty significant neck and shoulder pain. With activity and lifting she will get some pain referred to the biceps region. These are fleeting moments of referral pain and continues to have more pain in the neck.Feels like neck pain is improving very slowly. Will have a good PT session and feel better, but then will do something and have right arm pain. She has been taking Advil, which does help with the pain but "is not good for blood pressure." She only takes 1-2 Advil per day. Taking muscle relaxers ocassionaly which also help. Took one Tylenol 3 and had GI issues so did not take another one. Woke up at about 0230 this morning with right shoulder/ neck pain of about a six. Pain scale ranges from 5-7 most days.  She's had a lot of stress as well her pet dog was having some issues last night and this morning. She is also had some issues at work with reduced amount of staph and people having to double work hours. She has not noticed any numbness tingling or paresthesia. No focal weakness. She's had no associated headaches or other neurologic complaints.     Review of Systems  Constitutional: Negative for chills, fever, malaise/fatigue and weight loss.  HENT: Negative for hearing loss and sinus pain.   Eyes: Negative for  blurred vision, double vision and photophobia.  Respiratory: Negative for cough and shortness of breath.   Cardiovascular: Negative for chest pain, palpitations and leg swelling.  Gastrointestinal: Negative for abdominal pain, nausea and vomiting.  Genitourinary: Negative for flank pain.  Musculoskeletal: Positive for back pain and joint pain. Negative for myalgias.  Skin: Negative for itching and rash.  Neurological: Negative for tremors, focal weakness and weakness.  Endo/Heme/Allergies: Negative.   Psychiatric/Behavioral: Negative for depression.  All other systems reviewed and are negative.  Otherwise per HPI.  Assessment & Plan: Visit Diagnoses:  1. Cervicalgia   2. 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 Cervical radiculopathy   5. Neck pain on right side     Plan: Findings:  Chronic intermittent neck shoulder and arm pain which I feel is still mostly myofascial pain. She reports that with dry needling she'll get a lot of almost complete relief particularly with the referral type pain in the arm which would lead to complete the triggerpoints are mainly responsible for that. She does however get some symptoms with activity that seemed to refer which could be something of a disc related issue of the cervical spine. This is not constant there's no paresthesias. She has been doing well with physical therapy. I think at this point she should continue with them for a few more sessions to get a good home exercise program. She can always go back for a tuneup  with dry needling or even see [place] here in the office for trigger point injection should that be an issue. I think for now to there's not any red flag symptoms at maybe 1 a get an MRI this early. It's been about 6-7 weeks since his current episode of pain started and she has shown some improvement overall. She is concerned about anti-inflammatory with her hypertension. She sees Dr.Skains for her hypertension. I think at  this point using meloxicam once a day for the next few weeks maybe 3 weeks would be beneficial for her while she is finishing therapy. I don't think this is going to be an appreciable problem with hypertension or raising her risk of cardiovascular event just over the next 3 weeks. However, today I did note that her blood pressure was quite high some of this I think is white coat syndrome. And I will will prescribe that today. She can continue to use the methocarbamol as needed. I also want to add  Zanaflex at night just to see if maybe decreasing some of the sympathetic tone will help with the myofascial pain. She's guessing how this does over the next few weeks and call us. Depending on those results we would look at an MRI of the cervical spine.    Meds & Orders:  Meds ordered this encounter  Medications  . meloxicam (MOBIC) 15 MG tablet    Sig: Take 1 tablet (15 mg total) by mouth daily. Take with food    Dispense:  30 tablet    Refill:  0  . tiZANidine (ZANAFLEX) 4 MG capsule    Sig: Take 1 capsule (4 mg total) by mouth at bedtime.    Dispense:  30 capsule    Refill:  0  . methocarbamol (ROBAXIN) 500 MG tablet    Sig: Take 1 tablet (500 mg total) by mouth every 8 (eight) hours as needed for muscle spasms.    Dispense:  60 tablet    Refill:  0   No orders of the defined types were placed in this encounter.   Follow-up: Return if symptoms worsen or fail to improve, for Consider MRI of the cervical spine without contrast.   Procedures: No procedures performed  No notes on file   Clinical History: No specialty comments available.  She reports that she has quit smoking. She has never used smokeless tobacco. No results for input(s): HGBA1C, LABURIC in the last 8760 hours.  Objective:  VS:  HT:    WT:   BMI:     BP:(!) 172/119  HR:79bpm  TEMP: ( )  RESP:  Physical Exam  Constitutional: She is oriented to person, place, and time. She appears well-developed and well-nourished.    Eyes: Conjunctivae and EOM are normal. Pupils are equal, round, and reactive to light.  Cardiovascular: Normal rate and intact distal pulses.   Pulmonary/Chest: Effort normal.  Musculoskeletal:  Cervical range of motion is actually improved since I last saw her. There is still focal trigger points and levator scapula and infraspinatus and rhomboid region. This does reproduce some of her pain in the arm. She has no shoulder impingement signs that are dramatic. She has good distal strength in the hands bilaterally with wrist extension long finger flexion and abduction.  Neurological: She is alert and oriented to person, place, and time. She displays normal reflexes. She exhibits normal muscle tone. Coordination normal.  Skin: Skin is warm and dry. No rash noted. No erythema.  Psychiatric: She has a normal  mood and affect. Her behavior is normal.  Nursing note and vitals reviewed.   Ortho Exam Imaging: No results found.  Past Medical/Family/Surgical/Social History: Medications & Allergies reviewed per EMR Patient Active Problem List   Diagnosis Date Noted  . Uncontrolled hypertension 10/21/2014  . Essential hypertension 10/21/2014  . Tobacco abuse 10/21/2014  . Benign essential HTN 09/11/2014   Past Medical History:  Diagnosis Date  . Hypertension    Family History  Problem Relation Age of Onset  . Hypertension Father    Past Surgical History:  Procedure Laterality Date  . no surgical hx     Social History   Occupational History  . Not on file.   Social History Main Topics  . Smoking status: Former Research scientist (life sciences)  . Smokeless tobacco: Never Used  . Alcohol use Yes     Comment: stopped when found out pregnant prior to that glass or 2 of wine per day  . Drug use: No  . Sexual activity: Yes    Birth control/ protection: None

## 2017-06-22 ENCOUNTER — Other Ambulatory Visit: Payer: Self-pay | Admitting: Cardiology

## 2017-06-22 MED ORDER — ATENOLOL 50 MG PO TABS: 50.0000 mg | ORAL_TABLET | Freq: Every day | ORAL | 0 refills | Status: DC

## 2017-06-23 ENCOUNTER — Other Ambulatory Visit: Payer: Self-pay | Admitting: Cardiology

## 2017-06-24 ENCOUNTER — Other Ambulatory Visit: Payer: Self-pay | Admitting: Cardiology

## 2017-06-24 MED ORDER — LISINOPRIL-HYDROCHLOROTHIAZIDE 20-12.5 MG PO TABS: 1.0000 | ORAL_TABLET | Freq: Every day | ORAL | 0 refills | Status: DC

## 2017-06-24 MED ORDER — ATENOLOL 50 MG PO TABS: 50.0000 mg | ORAL_TABLET | Freq: Every day | ORAL | 0 refills | Status: DC

## 2017-06-24 MED ORDER — SPIRONOLACTONE 25 MG PO TABS: 25.0000 mg | ORAL_TABLET | Freq: Every day | ORAL | 0 refills | Status: DC

## 2017-06-24 NOTE — Telephone Encounter (Signed)
Pt's medications were sent to pt's pharmacy as requested. Confirmation received.  

## 2017-07-27 ENCOUNTER — Other Ambulatory Visit: Payer: Self-pay | Admitting: Cardiology

## 2017-07-28 NOTE — Telephone Encounter (Signed)
Medication Detail    Disp Refills Start End   atenolol (TENORMIN) 50 MG tablet 90 tablet 0 06/24/2017    Sig - Route: Take 1 tablet (50 mg total) by mouth daily. - Oral   Sent to pharmacy as: atenolol (TENORMIN) 50 MG tablet   Notes to Pharmacy: Please keep upcoming appointment for future refills. Thank you   E-Prescribing Status: Receipt confirmed by pharmacy (06/24/2017 10:26 AM EDT)   Pharmacy   [facility] [place], [place] [facility]

## 2017-09-07 ENCOUNTER — Encounter: Payer: Self-pay | Admitting: Cardiology

## 2017-09-21 ENCOUNTER — Encounter: Payer: Self-pay | Admitting: Cardiology

## 2017-09-21 ENCOUNTER — Ambulatory Visit (INDEPENDENT_AMBULATORY_CARE_PROVIDER_SITE_OTHER): Payer: BLUE CROSS/BLUE SHIELD | Admitting: Cardiology

## 2017-09-21 VITALS — BP 164/110 | HR 75 | Ht 64.5 in | Wt 193.4 lb

## 2017-09-21 DIAGNOSIS — I10 Essential (primary) hypertension: Secondary | ICD-10-CM

## 2017-09-21 DIAGNOSIS — Z79899 Other long term (current) drug therapy: Secondary | ICD-10-CM

## 2017-09-21 DIAGNOSIS — Z720 Tobacco use: Secondary | ICD-10-CM

## 2017-09-21 NOTE — Progress Notes (Signed)
Cardiology Office Note    Date:  09/21/2017   ID:  Lauren Blackburn, DOB 1976/04/23, MRN [id number]  PCP:  Gaynelle Arabian, MD  Cardiologist:   Candee Furbish, MD     History of Present Illness:  Lauren Blackburn is a 41 y.o. female here for uncontrolled hypertension follow-up.  Prior renal duplex was normal in 2014, creatinine was also normal, smoker.  Previous medications listed under allergies: - amlodipine causing confusion,  - Bystolic causing tongue swelling,  - chlorthalidone causing hypokalemia,  - metoprolol succinate causing fatigue,  - triamterene hydrochlorothiazide-ineffective for blood pressure, sulfa-hives.  Atenolol and lisinopril HCT was taken in [place]. BP was low at the time. This combination seemed to work she states. She was taken off of lisinopril previously because of pregnancy.  She has not been seen, did not come back for 2 week follow-up. On 11/11/15 she saw her primary physician, Dr. [name] who recommended that she return.  She has not had any clinical signs suggestive of pheochromocytoma. Hypertension is been long-standing since her 39s. She remembers prior success with atenolol and lisinopril HCT. Hypokalemia sometimes suggest hyperaldosteronism. No evidence of coarctation on exam. No bruits.  Possible future options include carvedilol, minoxidil.  Atenolol 50 mg, lisinopril hydrochlorothiazide 20/12.5 mg, spironolactone 25 mg were refilled by him recently.  GERD - acid reflux. Weight gain. Atypical chest pain because of this. SOB - feels like need to breath deeper. No exertional CP.    [name]. Cancer 23 brain tumors. Melanoma. He frequented cardiac rehabilitation. Was friends with [facility].   Don her father-in-law, patient of mine, died with melanoma. Her husband, his son found out that his mother in [place] had 2 sisters that possibly had fatal arrhythmias. His mother died suddenly. I recommended potential consultation with  electrophysiology for evaluation.   09/21/17-overall she is feeling well without any chest pain, strokelike symptoms, fevers, chills, syncope, bleeding.  She was hypertensive today in clinic.  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   Past Medical History:  Diagnosis Date  . Hypertension     Past Surgical History:  Procedure Laterality Date  . no surgical hx      Outpatient Medications Prior to Visit  Medication Sig Dispense Refill  . atenolol (TENORMIN) 50 MG tablet Take 1 tablet (50 mg total) by mouth daily. 90 tablet 0  . cetirizine (ZYRTEC) 10 MG tablet Take 10 mg by mouth daily.    Marland Kitchen lisinopril-hydrochlorothiazide (PRINZIDE,ZESTORETIC) 20-12.5 MG tablet Take 1 tablet by mouth daily. 90 tablet 0  . MULTIPLE VITAMIN PO Take 1 tablet by mouth daily.    Marland Kitchen spironolactone (ALDACTONE) 25 MG tablet Take 1 tablet (25 mg total) by mouth daily. 90 tablet 0  . acetaminophen-codeine (TYLENOL #3) 300-30 MG tablet Take 1 tablet by mouth every 8 (eight) hours as needed for moderate pain. (Patient not taking: Reported on 09/21/2017) 30 tablet 0  . meloxicam (MOBIC) 15 MG tablet Take 1 tablet (15 mg total) by mouth daily. Take with food (Patient not taking: Reported on 09/21/2017) 30 tablet 0  . methocarbamol (ROBAXIN) 500 MG tablet Take 1 tablet (500 mg total) by mouth every 8 (eight) hours as needed for muscle spasms. (Patient not taking: Reported on 09/21/2017) 60 tablet 0  . predniSONE (DELTASONE) 50 MG tablet Take 1 tablet daily with food for 5 days until finished (Patient not taking: Reported on 09/21/2017) 5 tablet 0  . tiZANidine (ZANAFLEX) 4 MG capsule Take 1 capsule (4 mg total) by mouth  at bedtime. (Patient not taking: Reported on 09/21/2017) 30 capsule 0   No facility-administered medications prior to visit.      Allergies:   Amlodipine besylate; Chlorthalidone; Metoprolol; Nebivolol hcl; Other; Sulfa antibiotics; Triamterene; Ciprofloxacin; and Influenza vac split quad   Social  History   Social History  . Marital status: Married    Spouse name: N/A  . Number of children: N/A  . Years of education: N/A   Social History Main Topics  . Smoking status: Former Research scientist (life sciences)  . Smokeless tobacco: Never Used  . Alcohol use Yes     Comment: stopped when found out pregnant prior to that glass or 2 of wine per day  . Drug use: No  . Sexual activity: Yes    Birth control/ protection: None   Other Topics Concern  . None   Social History Narrative  . None     Family History:  The patient'sfamily history includes Hypertension in her father.   ROS:   Please see the history of present illness.    Review of Systems  All other systems reviewed and are negative.    PHYSICAL EXAM:   VS:  BP (!) 164/110   Pulse 75   Ht 5' 4.5" (1.638 m)   Wt 193 lb 6.4 oz (87.7 kg)   BMI 32.68 kg/m    GEN: Well nourished, well developed, in no acute distress  HEENT: normal  Neck: no JVD, carotid bruits, or masses Cardiac: RRR; no murmurs, rubs, or gallops,no edema  Respiratory:  clear to auscultation bilaterally, normal work of breathing GI: soft, nontender, nondistended, + BS MS: no deformity or atrophy  Skin: warm and dry, no rash Neuro:  Alert and Oriented x 3, Strength and sensation are intact Psych: euthymic mood, full affect   Wt Readings from Last 3 Encounters:  09/21/17 193 lb 6.4 oz (87.7 kg)  06/24/16 177 lb 12.8 oz (80.6 kg)  12/08/15 170 lb (77.1 kg)      Studies/Labs Reviewed:   EKG:  EKG is ordered today.  09/21/17-sinus rhythm 75 poor R wave progression no significant change.  12/08/15- sinus rhythm, 67, possible left atrial enlargement, poor R wave progression, personally viewed-  prior 10/21/14 shows sinus rhythm, borderline LVH, no other abnormalities personally viewed  Recent Labs: No results found for requested labs within last 8760 hours.   Lipid Panel No results found for: CHOL, TRIG, HDL, CHOLHDL, VLDL, LDLCALC, LDLDIRECT  Additional studies/  records that were reviewed today include:     ECHO 06/21/16: - Left ventricle: The cavity size was normal. Wall thickness was   normal. Systolic function was normal. The estimated ejection   fraction was in the range of 55% to 60%. Wall motion was normal;   there were no regional wall motion abnormalities. Left   ventricular diastolic function parameters were normal. - Pericardium, extracardiac: A trivial pericardial effusion was   identified.   ASSESSMENT:    1. Essential hypertension   2. Long-term use of high-risk medication   3. Tobacco abuse      PLAN:  In order of problems listed above:  1. Hypertension -Blood pressure was well controlled currently on this regimen. She reminded me that she was diagnosed with hypertension when she weighed 120 pounds. Try to avoid NSAIDs, ibuprofen was listed on her medication list. Previous secondary workup, renal ultrasound/duplex for instance was unremarkable. Her blood pressure previously in clinic is excellent.  She understands that this does increase her risk of stroke, MI.  echocardiogram Reassuring. No evidence of left ventricular hypertrophy. Will ask her to record BP at home at periodic times through the day. Checking BMET 2. Tobacco use-encouraged cessation. She and her husband are going to try to quit surrounding her upcoming vacation. She has quit in the past.    Medication Adjustments/Labs and Tests Ordered: Current medicines are reviewed at length with the patient today.  Concerns regarding medicines are outlined above.  Medication changes, Labs and Tests ordered today are listed in the Patient Instructions below. Patient Instructions  Medication Instructions:  The current medical regimen is effective;  continue present plan and medications.  Labwork: Please have blood work today. (BMP)  Follow-Up: Please see the Hypertension Clinic in approximately 2 months. Keep a blood pressure diary and bring it with you when you come  for this appointment.  Follow up in 6 months with Dr. Marlou Porch.  You will receive a letter in the mail 2 months before you are due.  Please call us when you receive this letter to schedule your follow up appointment.  If you need a refill on your cardiac medications before your next appointment, please call your pharmacy.  Thank you for choosing [facility]!!          Signed, Candee Furbish, MD  09/21/2017 4:34 PM    [address], [place],   [address] Phone: [phone]; Fax: [phone]

## 2017-09-21 NOTE — Patient Instructions (Signed)
Medication Instructions:  The current medical regimen is effective;  continue present plan and medications.  Labwork: Please have blood work today. (BMP)  Follow-Up: Please see the Hypertension Clinic in approximately 2 months. Keep a blood pressure diary and bring it with you when you come for this appointment.  Follow up in 6 months with Dr. Marlou Porch.  You will receive a letter in the mail 2 months before you are due.  Please call us when you receive this letter to schedule your follow up appointment.  If you need a refill on your cardiac medications before your next appointment, please call your pharmacy.  Thank you for choosing [facility]!!

## 2017-09-22 ENCOUNTER — Other Ambulatory Visit: Payer: Self-pay | Admitting: Cardiology

## 2017-09-22 LAB — BASIC METABOLIC PANEL
BUN / CREAT RATIO: 12 (ref 9–23)
BUN: 12 mg/dL (ref 6–24)
CO2: 24 mmol/L (ref 20–29)
CREATININE: 1.01 mg/dL — AB (ref 0.57–1.00)
Calcium: 9.8 mg/dL (ref 8.7–10.2)
Chloride: 100 mmol/L (ref 96–106)
GFR calc Af Amer: 79 mL/min/{1.73_m2} (ref >59)
GFR, EST NON AFRICAN AMERICAN: 69 mL/min/{1.73_m2} (ref >59)
Glucose: 87 mg/dL (ref 65–99)
Potassium: 3.5 mmol/L (ref 3.5–5.2)
SODIUM: 142 mmol/L (ref 134–144)

## 2017-10-04 DIAGNOSIS — R05 Cough: Secondary | ICD-10-CM | POA: Diagnosis not present

## 2017-10-04 DIAGNOSIS — J069 Acute upper respiratory infection, unspecified: Secondary | ICD-10-CM | POA: Diagnosis not present

## 2017-10-20 ENCOUNTER — Other Ambulatory Visit: Payer: Self-pay | Admitting: Cardiology

## 2017-10-25 ENCOUNTER — Other Ambulatory Visit: Payer: Self-pay | Admitting: Cardiology

## 2017-11-07 ENCOUNTER — Ambulatory Visit: Payer: BLUE CROSS/BLUE SHIELD

## 2017-11-07 NOTE — Progress Notes (Deleted)
Patient ID: Lauren Blackburn                 DOB: [birth year]                      MRN: [id number]     HPI: Lauren Blackburn is a 41 y.o. female patient of Dr. Marlou Porch who presents today for hypertension evaluation.  PMH includes HTN, tobacco abuse.    Current HTN meds:  Atenolol 50mg  daily  Lisinopril/HCTZ 20/12.5mg  daily  Spironolactone 25mg  daily  Previously tried:  amlodipine causing confusion, Bystolic causing tongue swelling, chlorthalidone causing hypokalemia, metoprolol succinate causing fatigue, triamterene hydrochlorothiazide-ineffective for blood pressure, sulfa-hives.  BP goal: <130/80  Family History: hypertension in her father  Social History: current smoker?  Diet:   Exercise:   Home BP readings:   Wt Readings from Last 3 Encounters:  09/21/17 193 lb 6.4 oz (87.7 kg)  06/24/16 177 lb 12.8 oz (80.6 kg)  12/08/15 170 lb (77.1 kg)   BP Readings from Last 3 Encounters:  09/21/17 (!) 164/110  01/18/17 (!) 172/119  12/21/16 (!) 169/109   Pulse Readings from Last 3 Encounters:  09/21/17 75  01/18/17 79  12/21/16 79    Renal function: CrCl cannot be calculated (Patient's most recent lab result is older than the maximum 21 days allowed.).  Past Medical History:  Diagnosis Date  . Hypertension     Current Outpatient Medications on File Prior to Visit  Medication Sig Dispense Refill  . atenolol (TENORMIN) 50 MG tablet TAKE 1 TABLET BY MOUTH EVERY DAY 90 tablet 3  . cetirizine (ZYRTEC) 10 MG tablet Take 10 mg by mouth daily.    Marland Kitchen lisinopril-hydrochlorothiazide (PRINZIDE,ZESTORETIC) 20-12.5 MG tablet TAKE 1 TABLET BY MOUTH EVERY DAY 90 tablet 3  . MULTIPLE VITAMIN PO Take 1 tablet by mouth daily.    Marland Kitchen spironolactone (ALDACTONE) 25 MG tablet TAKE 1 TABLET BY MOUTH EVERY DAY 90 tablet 3   No current facility-administered medications on file prior to visit.     Allergies  Allergen Reactions  . Amlodipine Besylate     Confusion  . Chlorthalidone    Hypokalemia  . Metoprolol     Fatigue  . Nebivolol Hcl Swelling    Tongue Swelling  . Other     Acetaminophen Codeine   . Sulfa Antibiotics Hives  . Triamterene     Ineffective for BP  . Ciprofloxacin Rash  . Influenza Vac Split Quad Rash    There were no vitals taken for this visit.   Assessment/Plan: Hypertension: pregnancy?? Increase spiro vs. Lisinopril/HCTZ  Thank you, Lelan Pons. Patterson Hammersmith, [facility]  11/07/2017 12:17 PM

## 2018-09-11 ENCOUNTER — Other Ambulatory Visit: Payer: Self-pay | Admitting: Cardiology

## 2018-10-10 ENCOUNTER — Other Ambulatory Visit: Payer: Self-pay | Admitting: Cardiology

## 2018-10-14 ENCOUNTER — Other Ambulatory Visit: Payer: Self-pay | Admitting: Cardiology

## 2018-10-16 ENCOUNTER — Other Ambulatory Visit: Payer: Self-pay

## 2018-10-16 MED ORDER — ATENOLOL 50 MG PO TABS: 50.0000 mg | ORAL_TABLET | Freq: Every day | ORAL | 0 refills | Status: DC

## 2018-10-16 MED ORDER — LISINOPRIL-HYDROCHLOROTHIAZIDE 20-12.5 MG PO TABS: 1.0000 | ORAL_TABLET | Freq: Every day | ORAL | 0 refills | Status: DC

## 2018-10-16 MED ORDER — SPIRONOLACTONE 25 MG PO TABS: 25.0000 mg | ORAL_TABLET | Freq: Every day | ORAL | 0 refills | Status: DC

## 2018-10-16 NOTE — Telephone Encounter (Signed)
May refill 1 X only.  Pt needs to establish care with cardiologist in [place] or obtain medications from her PCP.

## 2018-10-16 NOTE — Telephone Encounter (Signed)
Pt is requesting that we fill her medications Atenolol, Lisinopril and Spironolactone. Pt has not seen Dr. Marlou Porch since last year . She states that she has moved to [facility] and has not had the chance to find a Cardiologist. She wants to know if we can fill her medications until she finds a doctor.  Preferred Pharmacy : [facility] [place], [place], [address]

## 2018-11-09 ENCOUNTER — Other Ambulatory Visit: Payer: Self-pay | Admitting: Cardiology

## 2018-11-24 ENCOUNTER — Other Ambulatory Visit: Payer: Self-pay | Admitting: Cardiology

## 2018-11-28 ENCOUNTER — Telehealth: Payer: Self-pay

## 2018-11-28 ENCOUNTER — Other Ambulatory Visit: Payer: Self-pay | Admitting: Cardiology

## 2018-11-28 NOTE — Telephone Encounter (Signed)
Pt called in requesting refills on lisinopril, atenolol, and spironolactone. She states that she has an upcoming appt with her new Dr. In [place] but not until March. She would like a call back at [phone]. Would you like to refill these medications? Please address. Thank you.

## 2018-11-28 NOTE — Telephone Encounter (Signed)
Pt was last seen here by Dr Marlou Porch 09/21/2017 and was to have followed up in 6 months but moved to [place], [place].  She was advised several times to establish with PCP and Cards there but has not been seen.  She has an appt per her report in March to est.  She would like you to continue to RX medications until she is seen there.  OK?

## 2018-11-29 MED ORDER — SPIRONOLACTONE 25 MG PO TABS: 25.0000 mg | ORAL_TABLET | Freq: Every day | ORAL | 3 refills | Status: DC

## 2018-11-29 MED ORDER — LISINOPRIL-HYDROCHLOROTHIAZIDE 20-12.5 MG PO TABS: 1.0000 | ORAL_TABLET | Freq: Every day | ORAL | 3 refills | Status: DC

## 2018-11-29 MED ORDER — ATENOLOL 50 MG PO TABS: 50.0000 mg | ORAL_TABLET | Freq: Every day | ORAL | 3 refills | Status: DC

## 2018-11-29 NOTE — Telephone Encounter (Signed)
OK to give her the medications. Candee Furbish, MD

## 2018-12-04 ENCOUNTER — Other Ambulatory Visit: Payer: Self-pay | Admitting: Cardiology

## 2019-02-06 ENCOUNTER — Other Ambulatory Visit: Payer: Self-pay | Admitting: Cardiology

## 2019-02-06 ENCOUNTER — Telehealth: Payer: Self-pay | Admitting: Cardiology

## 2019-02-06 MED ORDER — ATENOLOL 50 MG PO TABS: 50.0000 mg | ORAL_TABLET | Freq: Every day | ORAL | 0 refills | Status: AC

## 2019-02-06 MED ORDER — SPIRONOLACTONE 25 MG PO TABS: 25.0000 mg | ORAL_TABLET | Freq: Every day | ORAL | 0 refills | Status: AC

## 2019-02-06 MED ORDER — LISINOPRIL-HYDROCHLOROTHIAZIDE 20-12.5 MG PO TABS: 1.0000 | ORAL_TABLET | Freq: Every day | ORAL | 0 refills | Status: AC

## 2019-02-06 NOTE — Telephone Encounter (Signed)
°*  STAT* If patient is at the pharmacy, call can be transferred to refill team.   1. Which medications need to be refilled? (please list name of each medication and dose if known)  lisinopril-hydrochlorothiazide (PRINZIDE,ZESTORETIC) 20-12.5 MG tablet atenolol (TENORMIN) 50 MG tablet spironolactone (ALDACTONE) 25 MG tablet  2. Which pharmacy/location (including street and city if local pharmacy) is medication to be sent to? [id number] [place] [place] - [address]  3. Do they need a 30 day or 90 day supply? 45  Pt has an appt in [place] to get her medications, but does not feel comfortable going to the doctors office at this time. Would it be possible for Dr. Marlou Porch to submit one more request? Pt has about 20 days left of medication.   Please call the patient to advise if not allowed

## 2019-02-06 NOTE — Telephone Encounter (Signed)
Spoke with the pt and she is asking for one more refill for her BP meds.. she is living in [place] and has an appt with a new MD there 02/07/19 but with the [place] virus they are pushing her appt a few weeks out.. they have requested that she have her RX filled with Dr. Marlou Porch.. she has 20 day supply of each and asking for 1 more month to buy her enough time for her appt.. I sent in 1 month for each with no refills and she reports that would be enough.   I did stress to her the importance for follow up as soon as she can safely.Marland Kitchen and the need for updated labs.. pt verbalized understanding and agreed.  Addendum: [facility] [place] [facility] for April... pt advised.

## 2019-03-16 DIAGNOSIS — I10 Essential (primary) hypertension: Secondary | ICD-10-CM | POA: Diagnosis not present

## 2019-03-16 DIAGNOSIS — Z720 Tobacco use: Secondary | ICD-10-CM | POA: Diagnosis not present

## 2019-03-16 DIAGNOSIS — Z1231 Encounter for screening mammogram for malignant neoplasm of breast: Secondary | ICD-10-CM | POA: Diagnosis not present

## 2019-07-10 DIAGNOSIS — Z1159 Encounter for screening for other viral diseases: Secondary | ICD-10-CM | POA: Diagnosis not present

## 2019-08-02 DIAGNOSIS — Z1159 Encounter for screening for other viral diseases: Secondary | ICD-10-CM | POA: Diagnosis not present

## 2019-08-02 DIAGNOSIS — Z20828 Contact with and (suspected) exposure to other viral communicable diseases: Secondary | ICD-10-CM | POA: Diagnosis not present
